# Patient Record
Sex: Male | Born: 1955 | Race: White | Hispanic: No | Marital: Single | Smoking: Former smoker
Health system: Southern US, Community
[De-identification: ages and names within clinical notes are randomized; demographics above are authoritative.]

## PROBLEM LIST (undated history)

## (undated) DIAGNOSIS — G2581 Restless legs syndrome: Secondary | ICD-10-CM

## (undated) DIAGNOSIS — H8109 Meniere's disease, unspecified ear: Secondary | ICD-10-CM

## (undated) DIAGNOSIS — E785 Hyperlipidemia, unspecified: Secondary | ICD-10-CM

## (undated) DIAGNOSIS — N529 Male erectile dysfunction, unspecified: Secondary | ICD-10-CM

## (undated) DIAGNOSIS — Z8042 Family history of malignant neoplasm of prostate: Secondary | ICD-10-CM

## (undated) DIAGNOSIS — J189 Pneumonia, unspecified organism: Secondary | ICD-10-CM

## (undated) DIAGNOSIS — B1920 Unspecified viral hepatitis C without hepatic coma: Secondary | ICD-10-CM

## (undated) DIAGNOSIS — I10 Essential (primary) hypertension: Secondary | ICD-10-CM

## (undated) DIAGNOSIS — I2510 Atherosclerotic heart disease of native coronary artery without angina pectoris: Secondary | ICD-10-CM

## (undated) HISTORY — DX: Meniere's disease, unspecified ear: H81.09

## (undated) HISTORY — PX: PROSTATECTOMY: SHX69

## (undated) HISTORY — DX: Restless legs syndrome: G25.81

## (undated) HISTORY — DX: Hyperlipidemia, unspecified: E78.5

## (undated) HISTORY — DX: Male erectile dysfunction, unspecified: N52.9

## (undated) HISTORY — DX: Pneumonia, unspecified organism: J18.9

## (undated) HISTORY — PX: INGUINAL HERNIA REPAIR: SUR1180

## (undated) HISTORY — DX: Essential (primary) hypertension: I10

## (undated) HISTORY — PX: OTHER SURGICAL HISTORY: SHX169

## (undated) HISTORY — DX: Atherosclerotic heart disease of native coronary artery without angina pectoris: I25.10

## (undated) HISTORY — DX: Unspecified viral hepatitis C without hepatic coma: B19.20

## (undated) HISTORY — DX: Family history of malignant neoplasm of prostate: Z80.42

---

## 2001-04-19 HISTORY — PX: CARDIAC CATHETERIZATION: SHX172

## 2001-10-10 ENCOUNTER — Inpatient Hospital Stay (HOSPITAL_COMMUNITY): Admission: EM | Admit: 2001-10-10 | Discharge: 2001-10-11 | Payer: Self-pay | Admitting: Cardiology

## 2004-02-26 ENCOUNTER — Encounter (HOSPITAL_COMMUNITY): Admission: RE | Admit: 2004-02-26 | Discharge: 2004-02-27 | Payer: Self-pay | Admitting: *Deleted

## 2004-02-26 ENCOUNTER — Ambulatory Visit: Payer: Self-pay | Admitting: Cardiology

## 2004-03-04 ENCOUNTER — Ambulatory Visit: Payer: Self-pay | Admitting: *Deleted

## 2004-04-19 DIAGNOSIS — C61 Malignant neoplasm of prostate: Secondary | ICD-10-CM

## 2004-04-19 HISTORY — DX: Malignant neoplasm of prostate: C61

## 2004-05-19 ENCOUNTER — Ambulatory Visit: Payer: Self-pay | Admitting: Gastroenterology

## 2004-06-03 ENCOUNTER — Ambulatory Visit (HOSPITAL_COMMUNITY): Admission: RE | Admit: 2004-06-03 | Discharge: 2004-06-03 | Payer: Self-pay | Admitting: Gastroenterology

## 2004-06-03 ENCOUNTER — Encounter (INDEPENDENT_AMBULATORY_CARE_PROVIDER_SITE_OTHER): Payer: Self-pay | Admitting: Specialist

## 2004-06-18 ENCOUNTER — Inpatient Hospital Stay (HOSPITAL_COMMUNITY): Admission: RE | Admit: 2004-06-18 | Discharge: 2004-06-23 | Payer: Self-pay | Admitting: Urology

## 2004-09-24 ENCOUNTER — Ambulatory Visit: Payer: Self-pay | Admitting: Internal Medicine

## 2004-10-29 ENCOUNTER — Ambulatory Visit: Payer: Self-pay | Admitting: Internal Medicine

## 2005-02-18 ENCOUNTER — Ambulatory Visit: Payer: Self-pay | Admitting: Gastroenterology

## 2005-03-04 ENCOUNTER — Ambulatory Visit: Payer: Self-pay | Admitting: Gastroenterology

## 2005-03-18 ENCOUNTER — Ambulatory Visit: Payer: Self-pay | Admitting: Gastroenterology

## 2005-04-22 ENCOUNTER — Ambulatory Visit: Payer: Self-pay | Admitting: Gastroenterology

## 2005-05-27 ENCOUNTER — Ambulatory Visit: Payer: Self-pay | Admitting: Gastroenterology

## 2005-06-24 ENCOUNTER — Ambulatory Visit: Payer: Self-pay | Admitting: Gastroenterology

## 2005-07-22 ENCOUNTER — Ambulatory Visit: Payer: Self-pay | Admitting: Gastroenterology

## 2005-08-19 ENCOUNTER — Ambulatory Visit: Payer: Self-pay | Admitting: Gastroenterology

## 2005-10-28 ENCOUNTER — Ambulatory Visit: Payer: Self-pay | Admitting: Gastroenterology

## 2006-01-20 ENCOUNTER — Ambulatory Visit: Payer: Self-pay | Admitting: Gastroenterology

## 2006-02-07 ENCOUNTER — Ambulatory Visit (HOSPITAL_COMMUNITY): Admission: RE | Admit: 2006-02-07 | Discharge: 2006-02-07 | Payer: Self-pay | Admitting: Internal Medicine

## 2006-02-07 ENCOUNTER — Ambulatory Visit: Payer: Self-pay | Admitting: Internal Medicine

## 2006-08-18 IMAGING — NM NM MYOCAR PERF EJECTION FRACTION
2 series · 12 of 12 positions shown · non-contrast
Comparison: none

CLINICAL DATA: 48-year-old gentleman with nonobstructive coronary disease at catheterization in [DATE]; now returns with chest pain and palpitations
 STRESS MYOVIEW STUDY:
 RADIONUCLIDE DATA:  Two day rest/stress protocol performed with 20.0/30.0 mCi 6cTTm Myoview. 
 STRESS DATA:  Treadmill exercise to a workload of 10 mets and a heart rate of 162, 94% of age ? predicted maximum.  Exercise discontinued due to fatigue; no chest pain reported.  Blood pressure increased from a resting value of 120/70 to 170/70 during exercise and 180/70 early in recovery, a normal response.  No arrhythmias noted. 
 EKG:  Normal sinus rhythm; borderline first degree AV block; low voltage; otherwise within normal limits.  
 STRESS EKG:  Insignificant upsloping ST segment depression.  
 SCINTIGRAPHIC DATA:  Acquisition notable for mild diaphragmatic attenuation.  Left ventricular size was normal.  On tomographic images reconstructed in standard planes, there was uniform and normal uptake of tracer in all myocardial segments.  The rest images were unchanged.  The gated reconstruction demonstrated normal regional and global LV systolic function as well as normal systolic accentuation of activity in all regions.  Estimated ejection fraction was .58.

[Series 1: cs cardiac tc hi dose · 6.52mm/px · 6 of 512 frames shown]
[frame 43/512]
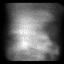
[frame 128/512]
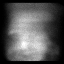
[frame 214/512]
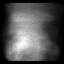
[frame 299/512]
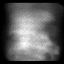
[frame 384/512]
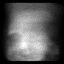
[frame 470/512]
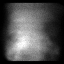

[Series 1: cr cardiac tc low dose · 6.52mm/px · 6 of 64 frames shown]
[frame 6/64]
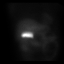
[frame 16/64]
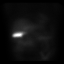
[frame 27/64]
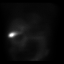
[frame 38/64]
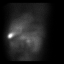
[frame 48/64]
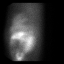
[frame 59/64]
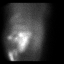

[12 of 12 positions shown; findings below may reference images not displayed]

IMPRESSION: Negative stress Myoview study revealing adequate exercise tolerance, a normal stress EKG, normal left ventricular size, normal left ventricular systolic function, and normal myocardial perfusion.

## 2010-09-04 NOTE — Op Note (Signed)
Theodore Hart, Theodore Hart              ACCOUNT NO.:  [id number]   MEDICAL RECORD NO.:  [id number]          PATIENT TYPE:  AMB   LOCATION:  DAY                           FACILITY:  [facility]   PHYSICIAN:  Ky Barban, M.D.DATE OF BIRTH:  [birth year]   DATE OF PROCEDURE:  06/18/2004  DATE OF DISCHARGE:                                 OPERATIVE REPORT   PREOPERATIVE DIAGNOSIS:  Carcinoma of the prostate.   POSTOPERATIVE DIAGNOSIS:  Carcinoma of the prostate.   PROCEDURE:  Radical retropubic prostatectomy, bilateral pelvic node  dissection.   SURGEON:  Ky Barban, M.D.   ASSISTANT:  Dennie Maizes, M.D.   ANESTHESIA:  Spinal plus general.   PROCEDURE:  Bilateral pelvic node dissection and radical retropubic  prostatectomy.   ESTIMATED BLOOD LOSS:  2000 mL.   REPLACEMENT:  Two units of packed cells.   Instrument, needle, sponge count correct.   PROCEDURE:  Patient given general endotracheal anesthesia and spinal  anesthesia, placed in semi-lithotomy position after the usual prep and  drape.  A #20 Foley catheter was inserted in the bladder.  Suprapubic  midline incision is made, ending about two inches below the umbilicus,  carried down through the subcutaneous tissue, rectus sheath incised, recti  separated in the midline.  The retropubic space is entered.  The external  iliac veins on both sides were exposed.  Self-retaining Bookwalter retractor  was placed.  We proceeded to do the lymph node dissection on the right side.  The fascia between the obturator nerve and vessels and between the external  iliac vein was completely removed, exposing the __________ fascia.  The  lymphatics were clipped and divided.  Specimen was sent for frozen section.  The pathologist called, and there were no lymph nodes.  I sent back and  removed some more tissue closer to the bladder and also skeletonized the  vessels along with the vessels and the obturator nerve.  Again the report  came  back negative.  I do not see any more tissue there.  The same thing  happened on the left side.  We do not see any lymphatic tissue in that area.  Whatever was there we removed, but he could not find any lymph nodes.  The  patient's PSA was below 10, so I am going to proceed and do his radical  retropubic prostatectomy.  The dorsal vein complex was ligated with 0 Vicryl  ties and the second stitch was placed at the level of the bladder neck, and  endopelvic fascia was opened up on both sides of the apex of the prostate.  Then the dorsal vein complex was divided.  Puboprostatic ligaments were  divided with retraction.  I again exposed the urethra, and a large right  angle is passed under the urethra and lifted it up, and anterior wall of the  urethra is divided and the Foley catheter was grabbed with the help of a  Vanderbilt clamp, and it was divided, keeping the clamp on the proximal end  and now the posterior wall of the urethra was divided under direct vision.  The  fascia along the side of the apex of the prostate was opened through the  endopelvic fascial opening and pushed the neurovascular bundles away from  the prostate from the apex and going toward superiorly alongside the lateral  lobe of the prostate and with slight traction on the catheter, the prostate  is lifted and neurovascular bundles along with the rectum is pushed away  from the prostate, and at this point the Denonvilliers' fascia is opened up  in the midline.  Seminal vesicles and vasa deferentia were exposed.  From  this end I was able to clip both vasa deferentia.  They were divided.  Seminal vesicles were separated from the sheath and the seminal vesicle  artery was clipped.  Part of the seminal vesicle on the left side came out,  sent as a separate specimen.  Now the dissection was carried out along the  side of the prostate.  The inferior and superior pedicles were clamped,  divided and ligated and divided.  The  anterior bladder neck was opened up.  The Foley catheter was removed, and a #5 ureteral catheter passed up both  ureters and stabilized to the bladder with 4-0 chromic stitch.  The  posterior part of the bladder neck was divided under vision.  Now the rest  of the superior pedicle was divided and ligated.  The remaining part of the  seminal vesicle came out from this side.  Specimen was removed.  Bleeders  were thoroughly inspected.  Some of them were fulgurated, and there is no  bleeding going on.  Proceeded to the bladder neck fashioning.  The extra  bladder neck which was opened was closed with 2-0 Vicryl interrupted  sutures.  The bladder neck mucosa was everted with a running stitch of 4-0  chromic to cover the bladder neck.  Now I am ready to do the anastomosis.  Operative site was thoroughly inspected and irrigated.  All the laps were  removed.  A #20 Foley catheter was inserted into the urethral stump and a  free tie was passed through the end of the catheter.  Balloon of the  catheter has been already checked.  Six stitches were placed in the bladder  neck and the urethral stump, and the Foley catheter was then placed in the  bladder and inflated with 30 mL of fluid.  The superior retractor blade  removed.  The bladder is pushed in the retropubic space, approximating the  urethral stump and the bladder neck.  While holding the traction on it, all  the six stitches were tied in the sequence they were placed.  The superior  stitch on each side was ligated with the dorsal vein complex stitch.  There  is no bleeding going on in the retropubic space, placed drain with a Shiley  sump, which came out along with the ureteral catheters through a separate  stab wound.  The ureteral catheters came out through the bladder separately,  not through the bladder neck.  The Shiley sump and the ureteral catheters  stabilized to the skin with a 0 silk stitch.  The rectus sheath closed with a running  stitch of 0 Vicryl.  Subcutaneous tissue was irrigated with  saline.  Skin was closed with staples.  Sterile gauze dressing applied.  Foley catheter left on drainage.  The patient left the operating room in  satisfactory condition.      MIJ/MEDQ  D:  06/18/2004  T:  06/18/2004  Job:  [id number]   cc:  Patrica Duel, M.D.  [address], [address]  [place]  [place] [address]  Fax: [phone]

## 2010-09-04 NOTE — H&P (Signed)
NAMEJUMA, OXLEY              ACCOUNT NO.:  [id number]   MEDICAL RECORD NO.:  [id number]          PATIENT TYPE:  AMB   LOCATION:                                FACILITY:  [facility]   PHYSICIAN:  Ky Barban, M.D.    DATE OF BIRTH:   DATE OF ADMISSION:  06/18/2004  DATE OF DISCHARGE:  LH                                HISTORY & PHYSICAL   CHIEF COMPLAINT:  Prostate cancer.   HISTORY:  A 55 year old gentleman who was seen by me on November 7 with a  PSA of 9.40 after he was referred to me by Dr. Nobie Putnam.  He has no  urological symptoms.  He was also found to have hepatitis C on routine  insurance physical exam.  No symptoms.  I did a prostate biopsy and our  pathologist read ________ positive for adenocarcinoma.  He got a second  opinion and this showed that he has more biopsy which are showing _________  positive and it is Gleason, grade is 5, 3+2.  The third biopsy is strongly  suspicious for low-grade adenocarcinoma.  I went over the biopsy report with  the patient.  Told him that how we would manage this problem.  Because of  his young age, I told him we need to be aggressive which means that we need  to do surgical treatment and remove the prostate.  But I also told him that  he can have radiotherapy.  The next time he brought his wife, so I went over  the procedure, risk/complications with both of them.  By this time, they  have already decided to have a radical prostatectomy.  I made it clear to  them they understand the complications:  1. Urinary incontinence sometimes  can be permanent.  2. Erectile dysfunction which will be permanent.  3. Need  for blood transfusion.  They understand and want me to go ahead and do a  radical retropubic prostatectomy and node dissection.  In the meantime, I  had him see his gastroenterologist in [place].  They have told him his  problem is minimal.  They do not see any contraindication to have a  prostatectomy.  So he is coming as  an outpatient and will undergo a radical  retropubic prostatectomy, then will be admitted to the hospital.   PAST MEDICAL HISTORY:  No history for diabetes or hypertension.  Had a left  inguinal hernia repair at age 43.  He also had a tracheotomy as a child when  he had pneumonia and hepatitis C.   FAMILY HISTORY:  No history of prostate cancer.   PERSONAL HISTORY:  Does not smoke or drink.   REVIEW OF SYSTEMS:  Unremarkable.   PHYSICAL EXAMINATION:  Blood pressure 117/80, temperature is normal.  CENTRAL NERVOUS SYSTEM:  No gross neurological deficits.  HEENT/NECK:  Negative.  CHEST:  Symmetrical.  HEART:  Regular sinus rhythm.  ABDOMEN:  Soft, flat.  Liver, kidneys and spleen are not palpable.  No CVA  tenderness.  GENITALIA:  External genitalia circumcised and meatus _________.  Testicles  are normal.  RECTAL:  Prostate 2+ smooth and firm.   IMPRESSION:  1.  Prostate cancer.  2.  Hepatitis C.   PLAN:  Radical retropubic prostatectomy, bilateral lymph node dissection,  frozen sections.      MIJ/MEDQ  D:  06/17/2004  T:  06/17/2004  Job:  [id number]   cc:   Patrica Duel, M.D.  [address]  [place]  [place] [address]  Fax: [phone]

## 2010-09-04 NOTE — Cardiovascular Report (Signed)
[place]. [facility]  Patient:    Theodore Hart, Theodore Hart Visit Number: [id number] MRN: [id number]          Service Type: MED Location: [phone] Attending Physician:  Learta Codding Dictated by:   Veneda Melter, M.D. [facility] Proc. Date: 10/11/01 Admit Date:  10/10/2001 Discharge Date: 10/11/2001   CC:         Luis Abed, M.D. [facility]  Jonell Cluck, M.D.  Selinda Flavin, M.D.   Cardiac Catheterization  PROCEDURES PERFORMED: 1. Left heart catheterization. 2. Left ventriculogram. 3. Selective coronary angiography. 4. Abdominal aortogram. 5. Intravascular ultrasound of the left anterior descending artery. 6. VasoSeal placement of the right femoral artery.  DIAGNOSES: 1. Mild coronary artery disease by angiogram. 2. Normal left ventricular systolic function.  HISTORY:  The patient is a 55 year old white male without known prior cardiac history.  He has a tobacco use and a strong family history of coronary disease who presents with nausea, diaphoresis, and substernal chest discomfort.  The patient was admitted to the hospital and ruled out for acute myocardial infarction and presents for further assessment.  DESCRIPTION OF PROCEDURE:  Informed consent was obtained.  The patient was brought to the catheterization lab.  A #6 French sheath was placed in the right femoral artery.  The #6 [place] and JR4 catheters were then used to engage the left and right coronary arteries and selective angiography performed in various projections using manual injections of contrast.  A #6 French pigtail catheter was then advanced to the left ventricle and a left ventriculogram performed using power injections of contrast.  The pigtail catheter was brought back into the descending aorta and an abdominal aortogram performed using power injections of contrast.  Preparations were then made for intravascular ultrasound of the LAD using a #6 [place] CLS 3.5 guide  catheter. The patient was given 3000 units of heparin intravenously.  A 0.014-inch Floppy wire was then used.  Intravascular ultrasound was then performed using automated pullback.  Repeat angiography was then performed after copious nitroglycerin and Verapamil for transient vasospasm.  This showed no vessel damage and resolution of the vasospasm.  The patient tolerated the procedure well.  The catheters and sheath were then removed and a VasoSeal closure device was deployed to the right femoral artery.  Hemostasis was achieved and the patient was transferred to the floor in stable condition.  He tolerated the procedure well.  Findings are as follows:  FINDINGS: 1. Left main trunk:  Large caliber vessel with mild irregularities.  2. LAD:  This begins as a large caliber vessel and provides three diagonal    branches in the mid section as it then extends to the apex.  The mid LAD    has mild irregularities of 30% most notable after the large second diagonal    branch.  The distal LAD has mild diffuse disease of 30% as well.  The    second diagonal branch is the largest of the vessels.  All diagonal    branches have mild irregularities.  3. Left circumflex artery:  This is a medium caliber vessel that provides    four small marginal branches.  There is mild diffuse disease of 30% in the    left circumflex system.  4. The right coronary artery is dominant.  This is a medium caliber vessel    that provides a posterior descending artery in its terminal segment.  The    right coronary artery has mild  irregularities of 20%.  The posterior    descending artery has focal narrowing of 30-40% in the mid section.  5. LV:  Normal end systolic and end diastolic dimensions.  Overall left    ventricular function is well preserved.  Ejection fraction of greater than    55%.  No mitral regurgitation.  6. LV pressure is 108/5.  Aortic is 108/80.  LVEDP equals 10.  7. Abdominal aorta is of normal  caliber.  The infrarenal segment is without    critical stenosis or dilatation.  The renal arteries are single and widely    patent bilaterally.  The iliac arteries are of normal caliber with only    mild atheromatous disease bilaterally.  ASSESSMENT AND PLAN:  The patient is a 55 year old gentleman with noncritical coronary artery disease and normal LV function.  Continued medical therapy and risk factor modification will be pursued.  Other causes of chest pain investigated and the patient randomized per protocol in the ASTEROID study for treatment of dyslipidemia. Dictated by:   Veneda Melter, M.D. [facility] Attending Physician:  Learta Codding DD:  10/11/01 TD:  10/12/01 Job: [id number]

## 2010-09-04 NOTE — Discharge Summary (Signed)
[place]. [facility]  Patient:    Theodore Hart, Theodore Hart Visit Number: [id number] MRN: [id number]          Service Type: MED Location: [phone] Attending Physician:  Learta Codding Dictated by:   Joellyn Rued, P.A.-C. Admit Date:  10/10/2001 Disc. Date: 10/11/01   CC:         Dr. Almond Lint in [place]  Dr. Patrica Duel in [facility], [address], [place], [place]  [address]   Referring Physician Discharge [place]  DATE OF BIRTH:  29-Apr-1955  ADMITTING PHYSICIAN:  Lewayne Bunting, M.D.  SUMMARY OF HISTORY:  The patient is a 55 year old white male who presented to [facility] after he developed the gradual onset of a dull ache in his left subscapular area radiating to his anterior chest and to his jaw that began on the day prior to his admission at [facility] (October 09, 2001). Associated with this he had profuse diaphoresis, nausea, and shortness of breath.  He presented to the emergency room and obtained prompt relief with sublingual nitroglycerin.  In retrospect, he describes episodes as a fist pushing down on his chest.  At [facility], EKGs and troponins were negative for myocardial infarction, although he does have a very early family history of premature coronary artery disease - his father died at the age of 77 with MI, labile hypertension, remote tobacco use, overweight, and inactivity.  Lipid status is unknown.  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  LABORATORY DATA:  At [facility] sodium was 142, potassium 3.5, BUN 9, creatinine 1.0, glucose 121.  Initial CK 77 with MB 0.9, troponin 0.02.  H&H 16.3 and 46.6, normal indices, platelets 311, wbcs 6.4.  Second CK 59 with MB 0.5 and troponin 0.01.  EKGs showed normal sinus rhythm, nonspecific ST-T wave changes.  HOSPITAL COURSE:  At [facility], Dr. Myrtis Ser saw the patient in consultation.  He was transferred to our  facility on June 24 to undergo cardiac catheterization.  This was performed on June 25 by Dr. Chales Abrahams. According to his progress note, he had an EF of 55%, mild irregularities in the left main, 30% lesion in the mid and proximal LAD.  the circumflex had four OMs.  He had a 30% proximal circumflex.  The RCA was dominant with a 30-40% PDA lesion.  Dr. Chales Abrahams felt that he had noncritical coronary artery disease with normal LV function and continue medical management and risk factor modification.  His catheterization site was closed with a VasoSeal post sheath removal.  Post bedrest he was ambulating without difficulty, catheterization site was intact; thus he was discharged home.  DISCHARGE MEDICATIONS: 1. He was given a new prescription for Protonix 40 mg q.d. 2. He was asked to continue:    a. HCTZ 25 q.d.    b. Meclizine 12.5 p.r.n.  ACTIVITY:  He was advised no lifting, driving, sexual activity, or heavy exertion for two days.  DIET:  Maintain low salt/fat/cholesterol diet.  SPECIAL INSTRUCTIONS:  He was advised to consider weight loss and exercise program.  WOUND CARE:  If he had any problems with the catheterization site he was asked to call the [facility] office immediately.  FOLLOW-UP:  He will make a follow-up appointment with Dr. Patrica Duel. Lipids were drawn prior to his discharge; however, not back yet in the computer.  He was instructed of this fact and that they will  be followed up with his primary care M.D. as an outpatient. Dictated by:   Joellyn Rued, P.A.-C. Attending Physician:  Learta Codding DD:  10/11/01 TD:  10/11/01 Job: [id number]

## 2010-09-04 NOTE — Procedures (Signed)
NAMEJANOAH, MENNA NO.:  [id number]   MEDICAL RECORD NO.:  [id number]           PATIENT TYPE:   LOCATION:                                 FACILITY:   PHYSICIAN:  [place] Bing, M.D.       DATE OF BIRTH:   DATE OF PROCEDURE:  02/26/2004  DATE OF DISCHARGE:                                  ECHOCARDIOGRAM   REFERRING PHYSICIAN:  Dr. Patrica Duel.  Dr. Vida Roller.   CLINICAL DATA:  A 55 year old gentleman with chest pain.   M-MODE:  1.  Aorta, 3.0.  2.  Left atrium 3.3.  3.  Septum 1.1.  4.  Posterior wall 1.1.  5.  LV diastole 4.1.  6.  LV systole 3.0.   FINDINGS:  1.  Technically adequate echocardiographic study.  2.  Normal left atrium, right atrium and right ventricle.  3.  Mild aortic valvular sclerosis with mild annular calcification; normal      valve function.  4.  Normal tricuspid valve with physiologic regurgitation and normal      estimated RV systolic pressure.  5.  Normal mitral valve.  6.  Normal pulmonic valve; normal pulmonary artery diameter.  7.  Normal IVC.  8.  Normal internal dimension of the left ventricle; borderline LVH; normal      regional and global LV systolic function.  9.  Normal Doppler examination.      RR/MEDQ  D:  02/27/2004  T:  02/27/2004  Job:  [id number]

## 2010-09-04 NOTE — Op Note (Signed)
NAMEJERRICO, Theodore Hart              ACCOUNT NO.:  [id number]   MEDICAL RECORD NO.:  [id number]          PATIENT TYPE:  AMB   LOCATION:  DAY                           FACILITY:  [facility]   PHYSICIAN:  R. Roetta Sessions, M.D. DATE OF BIRTH:  26-Jan-1956   DATE OF PROCEDURE:  02/07/2006  DATE OF DISCHARGE:                                  PROCEDURE NOTE   PROCEDURE:  Screening colonoscopy.   ENDOSCOPIST:  Jonathon Bellows, M.D.   INDICATIONS FOR PROCEDURE:  The patient is a 55 year old gentleman sent over  out of the courtesy of Dr. Patrica Duel for colorectal cancer screening.  He is devoid of any lower GI tract symptoms.  He has never had his lower GI  tract imaged and there is no family history for colorectal neoplasia.  Colonoscopy is now being done as a screening maneuver.  This approach has  been discussed with the patient at length.  Potential risks, benefits and  alternatives have been reviewed, questions answered and he is agreeable.  Please see documentation in the medical record.   PROCEDURE NOTE:  O2 saturation, blood pressure, pulse and respirations were  monitor throughout the entire procedure.   CONSCIOUS SEDATION:  Versed 5 mg IV, Demerol 75 mg IV in divided doses.   INSTRUMENT:  Olympus video chip system.   FINDINGS:  Digital rectal exam revealed no abnormalities.   ENDOSCOPIC FINDINGS:  The prep was good.   RECTUM:  Examination of the rectal mucosa including a retroflexed view of  the anal verge revealed no abnormalities.   COLON:  Colonic mucosa was surveyed from the rectosigmoid junction through  the left, transverse and right colon to the area of the appendiceal orifice,  ileocecal valve and cecum.  These structures were well seen and photographed  for the record.  From this level, the scope was slowly and cautiously  withdrawn.  All previously mentioned mucosal surfaces were again seen.  The  colonic mucosa appeared normal.  The patient tolerated the procedure  well  and was reactive at endoscopy.   IMPRESSION:  1. Normal rectum.  2. Normal colon.   RECOMMENDATIONS:  Repeat screening colonoscopy in 10 years.      Jonathon Bellows, M.D.  Electronically Signed     RMR/MEDQ  D:  02/07/2006  T:  02/08/2006  Job:  [id number]   cc:   Patrica Duel, M.D.  Fax: [phone]

## 2010-09-04 NOTE — Discharge Summary (Signed)
Hart, Theodore              ACCOUNT NO.:  [id number]   MEDICAL RECORD NO.:  [id number]          PATIENT TYPE:  INP   LOCATION:  A322                          FACILITY:  [facility]   PHYSICIAN:  Ky Barban, M.D.DATE OF BIRTH:  09-19-1955   DATE OF ADMISSION:  06/18/2004  DATE OF DISCHARGE:  03/07/2006LH                                 DISCHARGE SUMMARY   A 55 year old gentleman who was diagnosed with prostate cancer with biopsy  of the prostate because his PSA was elevated, and his PSA was 9.47.  Prostate biopsy shows that he has Gleason score of 5.  I discussed his  treatment options including radiotherapy, radical retropubic prostatectomy,  complications, benefits.  He elected to undergo radical prostatectomy.   The patient has hepatitis C.  Consultation preoperatively was obtained, and  it was advised that he can go ahead and have the surgery because he does not  need any treatment for now because his hepatitis C is so mild.   He underwent preoperative workup, CBC, urinalysis, MET-7, EKG, chest x-ray,  and all were normal.  He was taken to the operating room, underwent moderate  pelvic node dissection and radical retropubic prostatectomy.  It was done on  March 2.   He is doing fine.  He did spike a temperature during the night to 102.6.  Blood and urine culture were done, and he was stared on prophylactic  antibiotic with Cipro.  He was encouraged to do incentive spirometry.  His  urine is clear.  It should be mentioned that during the surgery he was given  2 units of packed cells also.   On the first postoperative day, his urine output is 1900 mL, and there is  some drainage of only 50 mL.  His potassium was 3.1, so we added KCl into  his IV fluids.   On the second postoperative day, sump is dry, so ureteral catheter and sump  drain were taken out.  He was discharged from ICU on March 6.  He had a  bowel movement.  It was noted that his sodium was 122, and I noted  that he  was drinking a lot of water which I told him to stop.  He was treated with  IV saline.  MET-7 in the morning was done, and sodium had come up to normal  to 136.   At this point, he was afebrile, up and walking, eating a regular diet.  I am  going to discharge him home.  Pathology report showed no involvement of the  margins.  His final pathology code is NO, NX, MX.  He will be followed by me  in the office.  I will take the stitches out next week and will see him  Thursday in the office.   FINAL DISCHARGE DIAGNOSES:  1.  Carcinoma of the prostate.  2.  Hepatitis C.   CONDITION ON DISCHARGE:  Improved.   DISCHARGE MEDICATIONS:  None.   FOLLOW UP:  I will see him back in the office on Thursday.      MIJ/MEDQ  D:  07/19/2004  T:  07/19/2004  Job:  [id number]   cc:   Patrica Duel, M.D.  [address]  [place]  [place] [address]  Fax: [phone]

## 2011-05-17 ENCOUNTER — Other Ambulatory Visit (HOSPITAL_COMMUNITY): Payer: Self-pay | Admitting: Internal Medicine

## 2011-05-19 ENCOUNTER — Other Ambulatory Visit (HOSPITAL_COMMUNITY): Payer: Self-pay | Admitting: Internal Medicine

## 2011-05-25 ENCOUNTER — Ambulatory Visit (HOSPITAL_COMMUNITY)
Admission: RE | Admit: 2011-05-25 | Discharge: 2011-05-25 | Disposition: A | Discharge status: Home / Self Care | Payer: BC Managed Care – PPO | Source: Ambulatory Visit | Attending: Internal Medicine | Admitting: Internal Medicine

## 2011-05-25 DIAGNOSIS — R7401 Elevation of levels of liver transaminase levels: Secondary | ICD-10-CM | POA: Insufficient documentation

## 2011-05-25 DIAGNOSIS — B1920 Unspecified viral hepatitis C without hepatic coma: Secondary | ICD-10-CM | POA: Insufficient documentation

## 2011-05-25 DIAGNOSIS — R7402 Elevation of levels of lactic acid dehydrogenase (LDH): Secondary | ICD-10-CM | POA: Insufficient documentation

## 2011-09-16 ENCOUNTER — Ambulatory Visit (INDEPENDENT_AMBULATORY_CARE_PROVIDER_SITE_OTHER): Payer: BC Managed Care – PPO | Admitting: Gastroenterology

## 2011-09-16 DIAGNOSIS — R945 Abnormal results of liver function studies: Secondary | ICD-10-CM

## 2011-09-17 LAB — CBC WITH DIFFERENTIAL/PLATELET
Basophils Absolute: 0.1 10*3/uL (ref 0.0–0.1)
Basophils Relative: 2 % — ABNORMAL HIGH (ref 0–1)
Eosinophils Absolute: 0.2 10*3/uL (ref 0.0–0.7)
Eosinophils Relative: 4 % (ref 0–5)
HCT: 41.8 % (ref 39.0–52.0)
Hemoglobin: 14.6 g/dL (ref 13.0–17.0)
Lymphocytes Relative: 31 % (ref 12–46)
Lymphs Abs: 1.6 10*3/uL (ref 0.7–4.0)
MCH: 31 pg (ref 26.0–34.0)
MCHC: 34.9 g/dL (ref 30.0–36.0)
MCV: 88.7 fL (ref 78.0–100.0)
Monocytes Absolute: 0.5 10*3/uL (ref 0.1–1.0)
Monocytes Relative: 10 % (ref 3–12)
Neutro Abs: 2.8 10*3/uL (ref 1.7–7.7)
Neutrophils Relative %: 53 % (ref 43–77)
Platelets: 266 10*3/uL (ref 150–400)
RBC: 4.71 MIL/uL (ref 4.22–5.81)
RDW: 13.2 % (ref 11.5–15.5)
WBC: 5.3 10*3/uL (ref 4.0–10.5)

## 2011-09-17 LAB — HEPATITIS C RNA QUANTITATIVE: HCV Quantitative: NOT DETECTED IU/mL (ref <43)

## 2011-09-17 LAB — IGG, IGA, IGM
IgA: 256 mg/dL (ref 68–379)
IgG (Immunoglobin G), Serum: 945 mg/dL (ref 650–1600)
IgM, Serum: 34 mg/dL — ABNORMAL LOW (ref 41–251)

## 2011-09-17 LAB — HEPATIC FUNCTION PANEL
ALT: 24 U/L (ref 0–53)
AST: 22 U/L (ref 0–37)
Albumin: 4.2 g/dL (ref 3.5–5.2)
Alkaline Phosphatase: 82 U/L (ref 39–117)
Bilirubin, Direct: 0.2 mg/dL (ref 0.0–0.3)
Indirect Bilirubin: 0.5 mg/dL (ref 0.0–0.9)
Total Bilirubin: 0.7 mg/dL (ref 0.3–1.2)
Total Protein: 6.5 g/dL (ref 6.0–8.3)

## 2011-09-17 LAB — IBC PANEL
%SAT: 36 % (ref 20–55)
TIBC: 315 ug/dL (ref 215–435)
UIBC: 201 ug/dL (ref 125–400)

## 2011-09-17 LAB — HEPATITIS B SURFACE ANTIGEN: Hepatitis B Surface Ag: NEGATIVE

## 2011-09-17 LAB — IRON: Iron: 114 ug/dL (ref 42–165)

## 2011-09-17 LAB — ALPHA-1-ANTITRYPSIN: A-1 Antitrypsin, Ser: 113 mg/dL (ref 90–200)

## 2011-09-17 LAB — PROTIME-INR
INR: 0.96 (ref <1.50)
Prothrombin Time: 13.2 seconds (ref 11.6–15.2)

## 2011-09-17 LAB — FERRITIN: Ferritin: 123 ng/mL (ref 22–322)

## 2011-09-17 LAB — ANA: Anti Nuclear Antibody(ANA): NEGATIVE

## 2011-09-20 LAB — ANTI-SMOOTH MUSCLE ANTIBODY, IGG: Smooth Muscle Ab: 5 U (ref <20)

## 2011-09-20 LAB — MITOCHONDRIAL ANTIBODIES: Mitochondrial M2 Ab, IgG: 0.27 (ref <0.91)

## 2011-09-23 ENCOUNTER — Encounter: Payer: Self-pay | Admitting: Gastroenterology

## 2011-09-23 NOTE — Progress Notes (Signed)
Theodore Hart, Theodore Hart  MR#:  [id number]      DATE:  09/16/2011  DOB:  October 23, 1955    cc: Consulting Physician:  K. Italy Hilty, MD, [facility], [address] [place], [place] [address], Fax [phone]  Primary Care Physician:  Same  Referring Physician:  Elfredia Nevins, MD, [facility], [address], [place], [place] [address], Fax [phone]    reason for visit referral:  Abnormal liver tests.   history:  The patient is a 56 year old gentleman who I have been asked to see in consultation by Dr. Sherwood Gambler regarding abnormal ALT and AST.   It will be recalled that the patient had previously been followed by [place] for his genotype 3 hepatitis C. I first saw him in consultation on 05/19/2004. He was sent in part of a life insurance exam in 2005. He was found to be hepatitis C antibody positive. This was confirmed with his primary care physician in mid October 2005. When seen by me on 05/19/2004, we the noted that he was genotype 3. He underwent a liver biopsy. When seen in our office on 05/19/2004, he was noted to be genotype 3. He underwent a liver biopsy on 06/03/2004, showing grade 2 stage I disease. 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 As far as I can see, I did not think that this was done. His ferritin, at his initial evaluation was 331, however.  He underwent treatment with Pegasys and ribavirin from 02/05/2005 to 07/16/2005, and had an SVR.  He was last seen on 01/20/2006, at which time his viral load was negative. He was suppose to return in 6 months' time, but did not.   More recently on 05/06/2011, as part of routine lab testing, he was found to have an AST of 54 and ALT 54. To investigate this without regard to his previous history of hepatitis C, he underwent a standard hepatitis acute panel,  which included a negative hepatitis B surface antigen on 05/19/2011. Not surprising his hepatitis C antibody was positive, as this would remain positive for life regardless of his viral load.   The patient has no symptoms to suggest active or decompensated liver disease. There are no symptoms to suggest cryoglobulin mediated disease. He is dark skinned, but he reports just returned from a vacation at the beach where there was a lot of sun exposure. There is no history of diabetes or heart failure.   With respect to risk factors for liver disease, he consumes only 3-4 beers during the 5 days of the week, 2-3 beers per weekend. He rarely drinks wine or liquor. There is no history of intravenous or intranasal drug use since his last appointment with [place]. He denies any history of tattoos or unsterile body piercing or blood transfusions prior to 1992. His family history is negative for liver disease. He has been vaccinated against hepatitis A and B, as part of his followup in our clinic.   PAST MEDICAL HISTORY:  Significant for genital herpes and Meniere disease. He reports approximately 5 years ago, he received 3-4 months of combination of prednisone and methotrexate to treat an autoimmune component to the Meniere disease.  He reports that at that time saw Dr. Kinnie Scales, and was told that his liver enzymes were normal. There is also history of genital herpes. He is seen by cardiology for preventative management as he  has a strong family history of cardiac disease, but he denies any cardiac disease. He is not a diabetic. History of prostate cancer in 2006, hypertension, and dyslipidemia.   PAST SURGICAL HISTORY:  None since last being seen. Previously had a tracheotomy at age 60 months and a herniorrhaphy, 27.   CURRENT MEDICATIONS:  1. Pravastatin 20 mg p.o. daily, which he stopped approximately a month ago, upon hearing about his abnormal liver tests.  2. Triamterene/hydrochlorothiazide 05/25 mg daily.   3. Metoprolol 25 mg daily.  4. Chromium 1000 mcg daily.  5. Potassium 99 mg p.r.n.  6. Calcium 600 mg daily.  7. Multivitamin daily.  8. Fish oil 2400 mg daily.  9. Cetirizine 10 mg daily, a generic for Zyrtec.  10. Omeprazole 20 mg p.o. p.r.n.   ALLERGIES: Denies.   HABITS: Smoking denies. Alcohol as above.   FAMILY HISTORY: As above.   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 He was accompanied by his girlfriend today. He has a son, a daughter, both age 25 in good health.   REVIEW OF SYSTEMS: All 10 systems reviewed today on the review of systems form, which was signed and placed in the chart. His CES-D was 5.   PHYSICAL EXAMINATION:   Constitutional: Well-appearing. He was tan as mentioned above. Vital signs: Height 68 inches, weight 173 pounds, blood pressure 122/85, pulse of 87, temperature 97.6 Fahrenheit. At his last clinic appointment, his weight was 173 pounds, on 01/20/2006. Ears, Nose, Mouth and Throat:  Unremarkable oropharynx.  No thyromegaly or neck masses.  Chest:  Resonant to percussion.  Clear to auscultation.  Cardiovascular:  Heart sounds normal S1, S2 without murmurs or rubs.  There is no peripheral edema.  Abdomen:  Normal bowel sounds.  No masses or tenderness.  I could not appreciate a liver edge or spleen tip.  I could not appreciate any hernias.  Lymphatics:  No cervical or inguinal lymphadenopathy.  Central Nervous System:  No asterixis or focal neurologic findings.  Dermatologic:  Anicteric without palmar erythema or spider angiomata.  Eyes:  Anicteric sclerae.  Pupils are equal and reactive to light.  LABORATORIES:  Previous labs on 05/06/2011, in addition to the liver tests, as mentioned above, his ALT was 76, total bilirubin 1.0, albumin 4.8, globulins 2.1. CBC unremarkable with a platelet count of 241. Triglycerides 136, TSH was normal at 0.953. I reviewed an ultrasound report from 05/25/2011, that showed that the liver was  normal in appearance.  On 05/19/2011, hepatitis B surface antigen was negative and the hepatitis C antibody, hepatitis C antibody as expected was positive.   ASSESSMENT:  The patient is a 56 year old gentleman with history of a genotype 3 hepatitis C with a liver biopsy on 06/03/2004, showing grade 2 stage I disease from hepatitis C. Of note, the iron on the liver biopsy was commented upon but ferritin was not significant enough to be concerned about hemochromatosis and apparently the biopsy was not quantitated, but he was HFE gene test negative on 09/24/2004, so it seems unlikely that he has hemochromatosis with a negative ferritin and negative HFE   gene testing. It was likely the iron was over called on the biopsy. However, of course, we cannot completely rule out the possibility of hemochromatosis as the cause of abnormal liver tests. Certainly with an SVR to treatment in treatment in the past, it is unlikely that this represents continued hepatitis C. It should be noted that the hepatitis C antibody was positive  for life and the determinant of active disease is by viral load and not by antibody. Nonalcoholic fatty liver disease seems unlikely considering that he does not have  central obesity. His ultrasound from 05/25/2011 did not show an echogenic liver. Drug-induced liver injury remains a possibility in that he was on a statin, although it in itself is not a contraindication to using a statin. There is an alcohol history, but the pattern of liver test abnormalities not consistent with alcohol. Autoimmune hepatitis and alpha-1 antitrypsin deficiency needs to be excluded as well.    In my discussion today with the patient and his girlfriend who accompanied him, we discussed the differential diagnosis for liver test abnormalities. I have explained to him that if he had an SVR in the past, I do not think this would represent continued hepatitis C infection. We discussed obtaining an HCV RNA to exclude  the possibility of continued hepatitis C. We discussed obtaining another set of labs and possibly repeating a liver biopsy should this be warranted. I also reviewed the ultrasound results with him in addition to his labs from Dr. Sharyon Medicus office.   PLAN:  1. Hepatitis A and B vaccination completed.  2. Standard liver tests.  3. Repeat HCV RNA.  4. Metabolic and autoimmune lab testing.  5. Followup will be determined based on the results of these tests.               Brooke Dare, MD   ADDENDUM:  All labs negative.  Liver tests are normal with AST 22 ALT 24.  HCV RNA undetectable.  The Hep C antibody will be positive for life but is NOT indicative of active infection  Even if there is a suspicion that a statin could have caused the liver tests to rise, the height of the liver tests should not have required stopping the statin.  Rather the liver tests could be monitored to make sure they do not double from the (elevated) baseline.  I have written to Mr Torrez.  There is no need to return.  [id number] .[id number]  D:  Thu May 30 17:00:28 2013 ; T:  Thu May 30 19:51:19 2013

## 2012-03-14 ENCOUNTER — Other Ambulatory Visit (HOSPITAL_COMMUNITY): Payer: Self-pay | Admitting: Cardiovascular Disease

## 2012-03-14 ENCOUNTER — Other Ambulatory Visit (HOSPITAL_COMMUNITY): Payer: Self-pay | Admitting: Internal Medicine

## 2012-03-14 DIAGNOSIS — I2510 Atherosclerotic heart disease of native coronary artery without angina pectoris: Secondary | ICD-10-CM

## 2012-03-14 DIAGNOSIS — R079 Chest pain, unspecified: Secondary | ICD-10-CM

## 2012-03-14 DIAGNOSIS — E785 Hyperlipidemia, unspecified: Secondary | ICD-10-CM

## 2012-04-04 ENCOUNTER — Ambulatory Visit (HOSPITAL_COMMUNITY)
Admission: RE | Admit: 2012-04-04 | Discharge: 2012-04-04 | Disposition: A | Discharge status: Home / Self Care | Payer: BC Managed Care – PPO | Source: Ambulatory Visit | Attending: Internal Medicine | Admitting: Internal Medicine

## 2012-04-04 DIAGNOSIS — R5383 Other fatigue: Secondary | ICD-10-CM | POA: Insufficient documentation

## 2012-04-04 DIAGNOSIS — I2510 Atherosclerotic heart disease of native coronary artery without angina pectoris: Secondary | ICD-10-CM | POA: Insufficient documentation

## 2012-04-04 DIAGNOSIS — R5381 Other malaise: Secondary | ICD-10-CM | POA: Insufficient documentation

## 2012-04-04 DIAGNOSIS — R0609 Other forms of dyspnea: Secondary | ICD-10-CM | POA: Insufficient documentation

## 2012-04-04 DIAGNOSIS — R0989 Other specified symptoms and signs involving the circulatory and respiratory systems: Secondary | ICD-10-CM | POA: Insufficient documentation

## 2012-04-04 DIAGNOSIS — E785 Hyperlipidemia, unspecified: Secondary | ICD-10-CM | POA: Insufficient documentation

## 2012-04-04 DIAGNOSIS — F17200 Nicotine dependence, unspecified, uncomplicated: Secondary | ICD-10-CM | POA: Insufficient documentation

## 2012-04-04 DIAGNOSIS — R079 Chest pain, unspecified: Secondary | ICD-10-CM | POA: Insufficient documentation

## 2012-04-04 DIAGNOSIS — Z8249 Family history of ischemic heart disease and other diseases of the circulatory system: Secondary | ICD-10-CM | POA: Insufficient documentation

## 2012-04-04 MED ORDER — TECHNETIUM TC 99M SESTAMIBI GENERIC - CARDIOLITE
30.1000 | Freq: Once | INTRAVENOUS | Status: AC | PRN
  Administered 2012-04-04: 30.1 via INTRAVENOUS

## 2012-04-04 MED ORDER — TECHNETIUM TC 99M SESTAMIBI GENERIC - CARDIOLITE
11.0000 | Freq: Once | INTRAVENOUS | Status: AC | PRN
  Administered 2012-04-04: 11 via INTRAVENOUS

## 2012-04-04 NOTE — Procedures (Addendum)
[facility] [address] [place] [address] [place] [address] [phone]  Cardiology Nuclear Med Study  Theodore Hart is a 56 y.o. male     MRN : [id number]     DOB: [birth year]  Procedure Date: 04/04/2012  Nuclear Med Background Indication for Stress Test:  Evaluation for Ischemia History:  CAD Cardiac Risk Factors: Family History - CAD, Lipids and Smoker  Symptoms:  Chest Pain, DOE and Fatigue   Nuclear Pre-Procedure Caffeine/Decaff Intake:  1:00am NPO After: 11:00am   IV Site: R Antecubital  IV 0.9% NS with Angio Cath:  22g  Chest Size (in):  42 IV Started by: Koren Shiver, CNMT  Height: 5\' 8"  (1.727 m)  Cup Size: n/a  BMI:  Body mass index is 27.37 kg/(m^2). Weight:  180 lb (81.647 kg)   Tech Comments:  Held metoprolol 24 hrs prior to test    Nuclear Med Study 1 or 2 day study: 1 day  Stress Test Type:  Stress  Order Authorizing Provider:  Zoila Shutter, MD   Resting Radionuclide: Technetium 35m Sestamibi  Resting Radionuclide Dose: 11.0 mCi   Stress Radionuclide:  Technetium 61m Sestamibi  Stress Radionuclide Dose: 30.1 mCi           Stress Protocol Rest HR: 84 Stress HR:184  Rest BP: 116/84 Stress BP: 149/72  Exercise Time (min): 10:00 METS: 11.70          Dose of Adenosine (mg):  n/a Dose of Lexiscan: n/a mg  Dose of Atropine (mg): n/a Dose of Dobutamine: n/a mcg/kg/min (at max HR)  Stress Test Technologist: Ernestene Mention, CCT Nuclear Technologist: Gonzella Lex, CNMT   Rest Procedure:  Myocardial perfusion imaging was performed at rest 45 minutes following the intravenous administration of Technetium 59m Sestamibi. Stress Procedure:  The patient performed treadmill exercise using a Bruce  Protocol for 10 minutes. The patient stopped due to leg fatigue and shortness of breath but no chest pain.  There were no significant ST-T wave changes.  Technetium 66m Sestamibi was injected at peak exercise and myocardial  perfusion imaging was performed after a brief delay.  Transient Ischemic Dilatation (Normal <1.22):  0.95 Lung/Heart Ratio (Normal <0.45):  0.40 QGS EDV:  67 ml QGS ESV:  19 ml LV Ejection Fraction: 72%  Signed by      Rest ECG: NSR - Normal EKG  Stress ECG: No significant change from baseline ECG  QPS Raw Data Images:  Normal; no motion artifact; normal heart/lung ratio. Stress Images:  Normal homogeneous uptake in all areas of the myocardium. Rest Images:  There is decreased uptake in the inferior wall felt to be due diaphragmatic attenuation. Subtraction (SDS):  No evidence of ischemia.  Impression Exercise Capacity:  Excellent exercise capacity. BP Response:  Normal blood pressure response. Clinical Symptoms:  No symptoms. ECG Impression:  No significant ST segment change suggestive of ischemia. Comparison with Prior Nuclear Study: No significant change from previous study  Overall Impression:  Normal stress nuclear study. Low risk stress nuclear study.  LV Wall Motion:  NL LV Function, EF 72%; NL Wall Motion   Jasher Barkan A, MD  04/04/2012 6:14 PM

## 2012-08-14 ENCOUNTER — Encounter: Payer: Self-pay | Admitting: Internal Medicine

## 2012-10-27 ENCOUNTER — Other Ambulatory Visit: Payer: Self-pay | Admitting: Internal Medicine

## 2012-10-30 ENCOUNTER — Other Ambulatory Visit: Payer: Self-pay | Admitting: Internal Medicine

## 2012-10-30 NOTE — Telephone Encounter (Signed)
Rx was sent to pharmacy electronically. 

## 2013-07-06 ENCOUNTER — Other Ambulatory Visit: Payer: Self-pay

## 2013-07-06 MED ORDER — PRAVASTATIN SODIUM 20 MG PO TABS: 20.0000 mg | ORAL_TABLET | Freq: Every day | ORAL | Status: AC

## 2013-07-06 NOTE — Telephone Encounter (Signed)
Rx was sent to pharmacy electronically. 

## 2013-08-10 ENCOUNTER — Other Ambulatory Visit: Payer: Self-pay

## 2013-08-10 MED ORDER — METOPROLOL SUCCINATE ER 25 MG PO TB24: 25.0000 mg | ORAL_TABLET | Freq: Every day | ORAL | Status: AC

## 2013-08-10 NOTE — Telephone Encounter (Signed)
Rx was sent to pharmacy electronically. 

## 2013-10-15 ENCOUNTER — Other Ambulatory Visit (HOSPITAL_COMMUNITY): Payer: Self-pay | Admitting: Internal Medicine

## 2013-10-15 DIAGNOSIS — R740 Nonspecific elevation of levels of transaminase and lactic acid dehydrogenase [LDH]: Secondary | ICD-10-CM

## 2013-10-15 DIAGNOSIS — R7401 Elevation of levels of liver transaminase levels: Secondary | ICD-10-CM

## 2013-10-15 DIAGNOSIS — K769 Liver disease, unspecified: Secondary | ICD-10-CM

## 2013-10-18 ENCOUNTER — Ambulatory Visit (HOSPITAL_COMMUNITY)
Admission: RE | Admit: 2013-10-18 | Discharge: 2013-10-18 | Disposition: A | Discharge status: Home / Self Care | Payer: 59 | Source: Ambulatory Visit | Attending: Internal Medicine | Admitting: Internal Medicine

## 2013-10-18 DIAGNOSIS — R740 Nonspecific elevation of levels of transaminase and lactic acid dehydrogenase [LDH]: Principal | ICD-10-CM

## 2013-10-18 DIAGNOSIS — R7401 Elevation of levels of liver transaminase levels: Secondary | ICD-10-CM | POA: Insufficient documentation

## 2013-10-18 DIAGNOSIS — K769 Liver disease, unspecified: Secondary | ICD-10-CM

## 2013-10-18 DIAGNOSIS — R7402 Elevation of levels of lactic acid dehydrogenase (LDH): Secondary | ICD-10-CM | POA: Insufficient documentation

## 2013-11-14 IMAGING — US US ABDOMEN COMPLETE
1 series · 14 of 25 positions shown · non-contrast
Comparison: None.

CLINICAL DATA: Hepatitis C.

COMPLETE ABDOMINAL ULTRASOUND

[Series 1: us abdomen complete · 0.23mm/px · 14 of 77 slices shown]
[im 1/77]
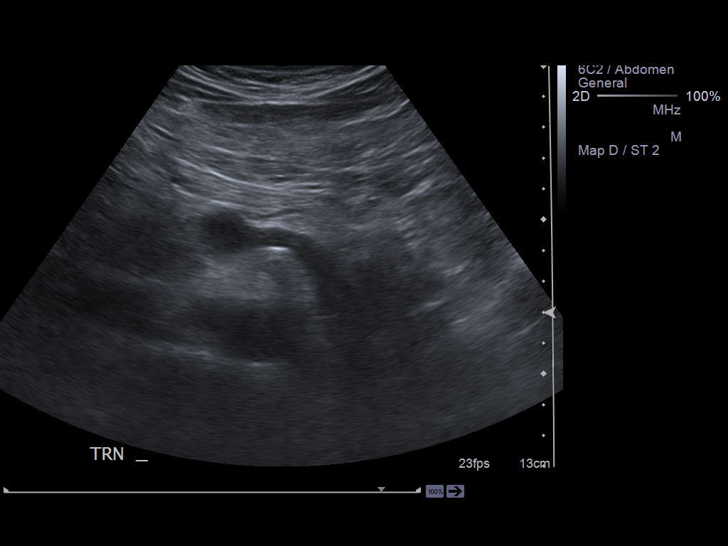
[im 7/77]
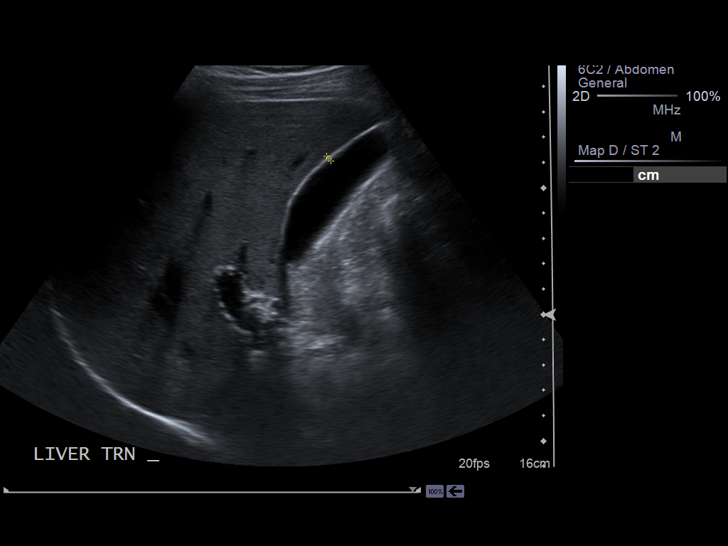
[im 13/77]
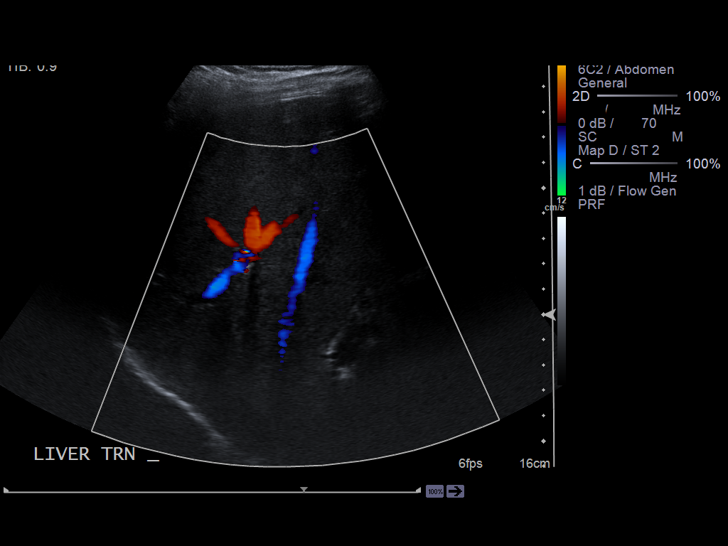
[im 20/77]
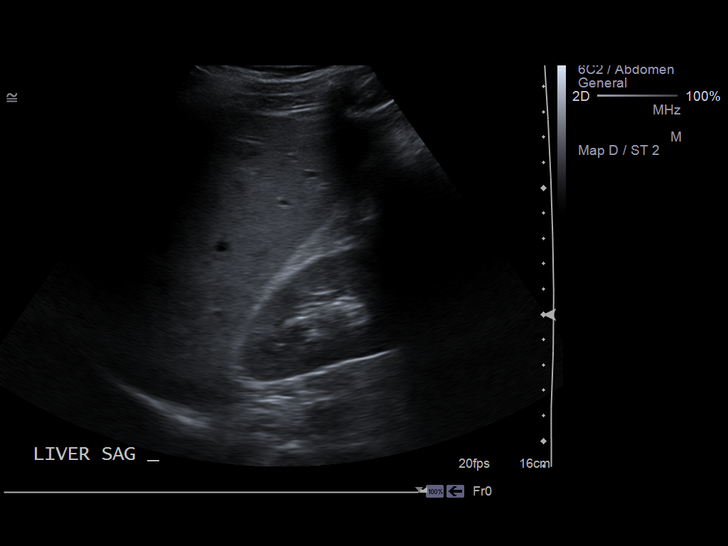
[im 26/77]
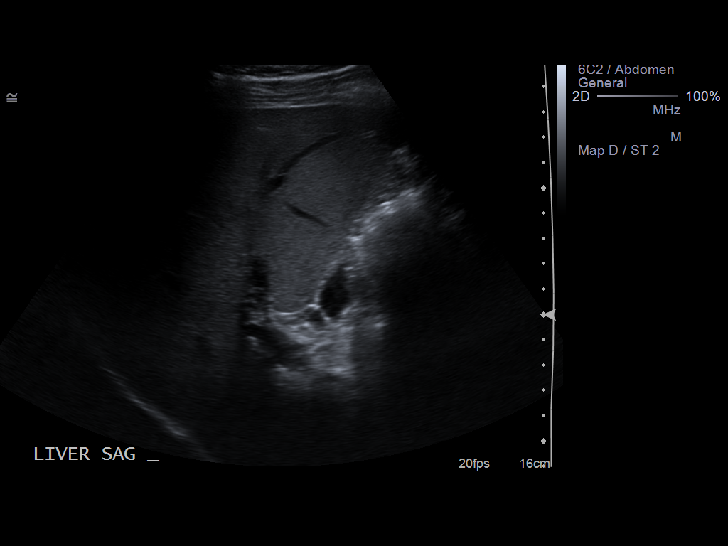
[im 29/77]
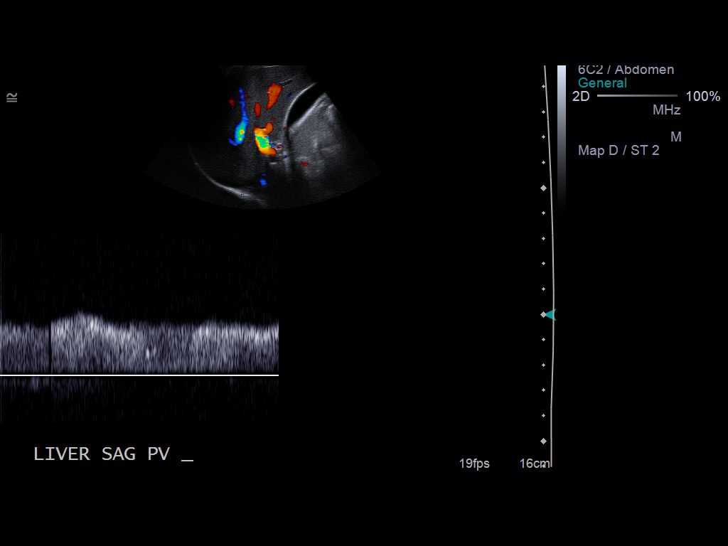
[im 35/77]
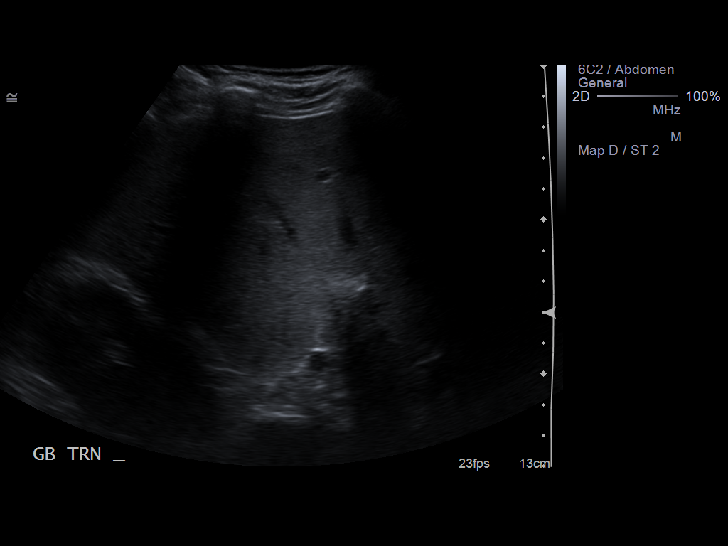
[im 42/77]
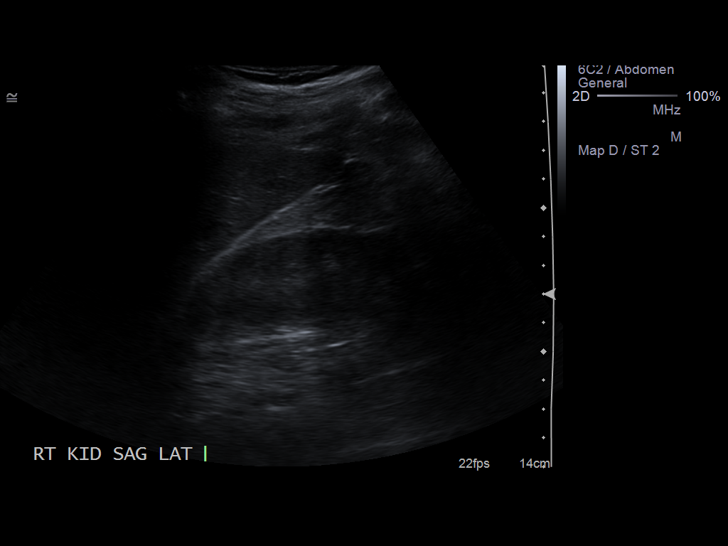
[im 48/77]
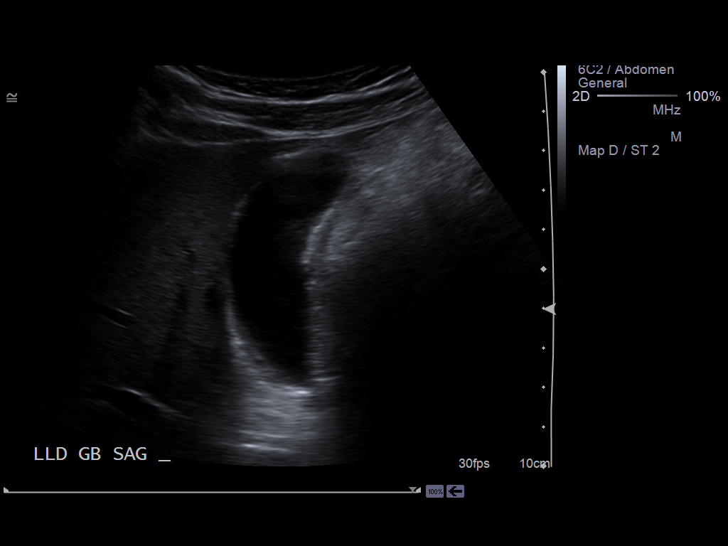
[im 51/77]
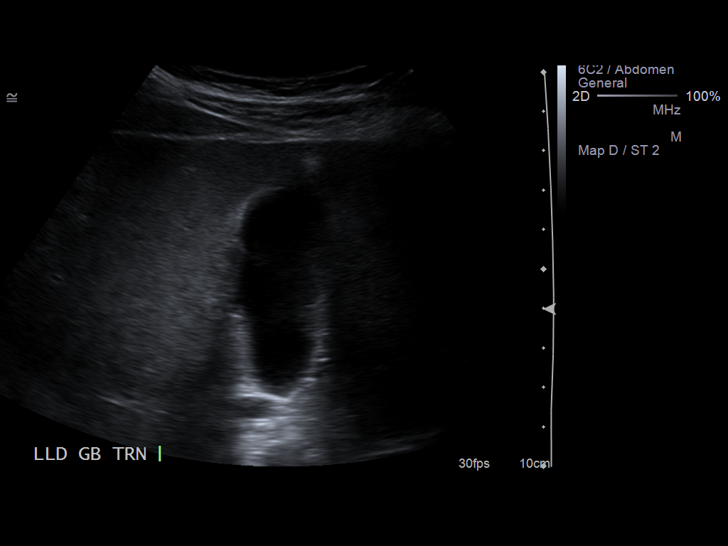
[im 58/77]
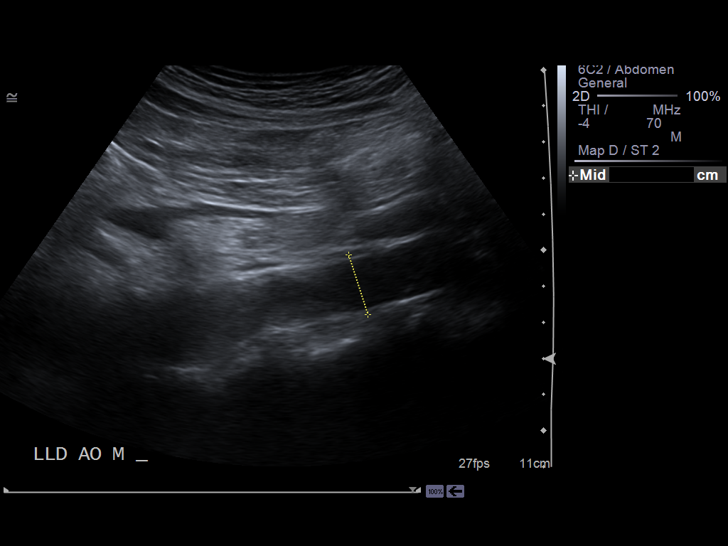
[im 64/77]
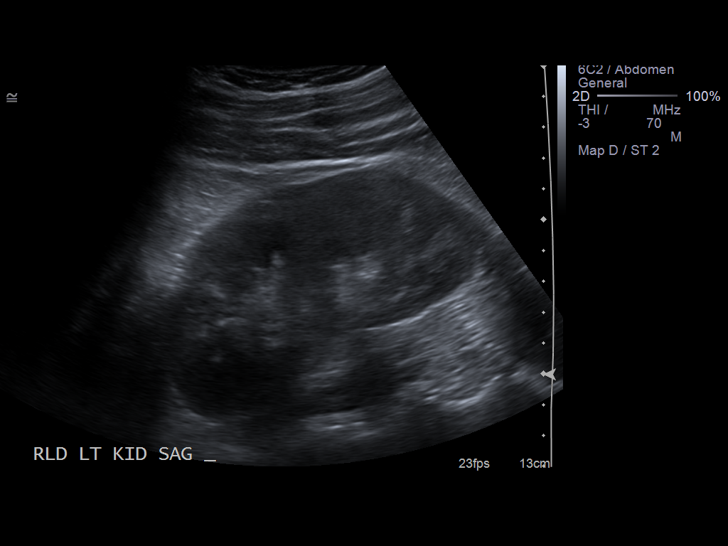
[im 70/77]
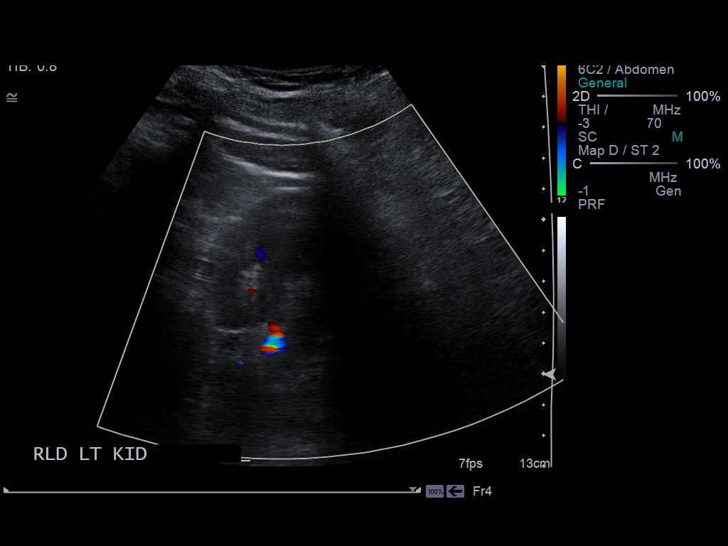
[im 77/77]
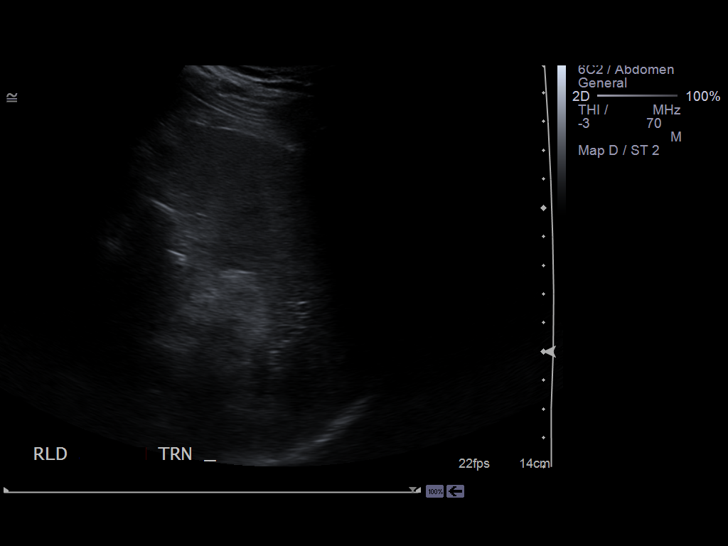

[14 of 25 positions shown; findings below may reference images not displayed]

FINDINGS: Gallbladder:  No gallbladder wall thickening, pericholecystic
fluid, or gallstone observed.  Sonographic Murphy's sign is absent.

Common bile duct:  Measures 5 mm in diameter, within normal limits.

Liver:  No focal lesion identified.  Within normal limits in
parenchymal echogenicity.

IVC:  Appears normal.

Pancreas:  Most of the pancreas is not well seen due to overlying
bowel gas.

Spleen:  Measures 7.2 cm craniocaudad and appears normal.

Right Kidney:  Measures 10.1 cm in length and appears normal.

Left Kidney:  Measures 1.0 cm in length and appears normal.

Abdominal aorta:  No aneurysm identified.
IMPRESSION: 1.  No focal hepatic lesion is sonographically.
2.  Most of the pancreas is not well seen due to overlying bowel
gas.
3.  Otherwise negative exam.

## 2014-08-29 ENCOUNTER — Encounter: Payer: Self-pay | Admitting: Cardiovascular Disease

## 2014-09-24 ENCOUNTER — Encounter: Payer: Self-pay | Admitting: *Deleted

## 2014-09-25 ENCOUNTER — Encounter: Payer: Self-pay | Admitting: Cardiovascular Disease

## 2014-09-25 ENCOUNTER — Ambulatory Visit (INDEPENDENT_AMBULATORY_CARE_PROVIDER_SITE_OTHER): Payer: 59 | Admitting: Cardiovascular Disease

## 2014-09-25 VITALS — BP 124/81 | HR 86 | Ht 68.0 in | Wt 186.0 lb

## 2014-09-25 DIAGNOSIS — Z719 Counseling, unspecified: Secondary | ICD-10-CM

## 2014-09-25 DIAGNOSIS — I2583 Coronary atherosclerosis due to lipid rich plaque: Principal | ICD-10-CM

## 2014-09-25 DIAGNOSIS — Z136 Encounter for screening for cardiovascular disorders: Secondary | ICD-10-CM | POA: Diagnosis not present

## 2014-09-25 DIAGNOSIS — E785 Hyperlipidemia, unspecified: Secondary | ICD-10-CM

## 2014-09-25 DIAGNOSIS — I2510 Atherosclerotic heart disease of native coronary artery without angina pectoris: Secondary | ICD-10-CM | POA: Diagnosis not present

## 2014-09-25 DIAGNOSIS — Z8249 Family history of ischemic heart disease and other diseases of the circulatory system: Secondary | ICD-10-CM

## 2014-09-25 DIAGNOSIS — Z713 Dietary counseling and surveillance: Secondary | ICD-10-CM

## 2014-09-25 DIAGNOSIS — Z7182 Exercise counseling: Secondary | ICD-10-CM

## 2014-09-25 NOTE — Patient Instructions (Signed)
Continue all current medications. Your physician wants you to follow up in:  1 year.  You will receive a reminder letter in the mail one-two months in advance.  If you don't receive a letter, please call our office to schedule the follow up appointment   

## 2014-09-25 NOTE — Progress Notes (Signed)
Patient ID: Theodore Hart, male   DOB: 1956/02/13, 59 y.o.   MRN: [id number]       CARDIOLOGY CONSULT NOTE  Patient ID: Theodore Hart MRN: [id number] DOB/AGE: 1955-11-21 59 y.o.  Admit date: (Not on file) Primary Physician Glo Herring., MD  Reason for Consultation: CAD  HPI: The patient is a 59 year old male with a history of nonobstructive coronary artery disease in June 2003, hyperlipidemia, prostate cancer s/p radical prostatectomy, and hepatitis C. Takes triamterene-HCTZ for Meniere's disease.  Coronary angiography in June 2003 demonstrated mid LAD 30% stenosis and distal LAD 30% stenosis, mild diffuse 30% stenosis in the circumflex, 30-40% PDA stenosis and 20% stenosis in the RCA. Left ventricular systolic function was normal.  He saw Dr. Debara Pickett in January 2014 for chest pain which was deemed noncardiac in etiology. He reportedly underwent a nuclear stress test which was normal on 04/04/2012, with a calculated LVEF of 72%.   His father died suddenly of an MI at age 41, his grandfather died of an MI at 36, and his great-grandfather died of an MI at 61. He used to smoke but quit cigarettes in 2000. 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 He has put on some weight since retirement but is now trying to lose it by walking and exercising. He denies exertional chest pain, palpitations, and shortness of breath. He denies orthopnea and leg swelling. He feels well.  ECG performed in the office today demonstrates normal sinus rhythm, heart rate 89 bpm, with no ischemic ST segment or T-wave abnormalities.   Soc: Married. Quit smoking in 2000. 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  Fam: His father died suddenly of an MI at age 22, his grandfather died of an MI at 52, and his great-grandfather died of an MI  at 30.   No Known Allergies  Current Outpatient Prescriptions  Medication Sig Dispense Refill  . ALPRAZolam (XANAX) 0.5 MG tablet Take 1 tablet by mouth at bedtime.    Marland Kitchen aspirin EC 81 MG tablet Take 81 mg by mouth daily.    . metoprolol succinate (TOPROL XL) 25 MG 24 hr tablet Take 1 tablet (25 mg total) by mouth daily. 10 tablet 0  . Multiple Vitamin (MULTIVITAMIN) tablet Take 1 tablet by mouth daily.    . Omega-3 Fatty Acids (FISH OIL) 1200 MG CAPS Take 1 capsule by mouth 2 (two) times daily.    . Potassium Gluconate 595 MG TBCR Take 1 tablet by mouth daily.    . pravastatin (PRAVACHOL) 20 MG tablet Take 1 tablet (20 mg total) by mouth daily. 15 tablet 0  . triamterene-hydrochlorothiazide (DYAZIDE) 37.5-25 MG per capsule TAKE 1 CAPSULE EVERY DAY 30 capsule 6  . Triprolidine-Pseudoephedrine (ANTIHISTAMINE PO) Take 1 tablet by mouth daily.    . vitamin C (ASCORBIC ACID) 500 MG tablet Take 500 mg by mouth daily.     No current facility-administered medications for this visit.    Past Medical History  Diagnosis Date  . Meniere disease     w/bilateral hearing aids  . FH: prostate cancer 2006    s/p radical protatectomy  . Hepatitis C   . Erectile dysfunction   . CAD (coronary artery disease)     nuclear stress test 04/04/12 EF 72% with no evidence of ischemia, normal wall motion  .  Hypertension   . Dyslipidemia     Past Surgical History  Procedure Laterality Date  . Inguinal hernia repair      age 49  . Other surgical history      tracheotomy as a child  . Prostatectomy    . Cardiac catheterization  2003    mild to moderate by cath    History   Social History  . Marital Status: Married    Spouse Name: N/A  . Number of Children: N/A  . Years of Education: N/A   Occupational History  . Not on file.   Social History Main Topics  . Smoking status: Former Smoker -- 1.00 packs/day for 28 years    Types: Cigarettes    Start date: 07/27/1970    Quit date: 04/19/1998    . Smokeless tobacco: Never Used  . Alcohol Use: Not on file  . Drug Use: Not on file  . Sexual Activity: Not on file   Other Topics Concern  . Not on file   Social History Narrative       Prior to Admission medications   Medication Sig Start Date End Date Taking? Authorizing Provider  ALPRAZolam Duanne Moron) 0.5 MG tablet Take 1 tablet by mouth at bedtime. 08/30/14  Yes Historical Provider, MD  aspirin EC 81 MG tablet Take 81 mg by mouth daily.   Yes Historical Provider, MD  metoprolol succinate (TOPROL XL) 25 MG 24 hr tablet Take 1 tablet (25 mg total) by mouth daily. 08/10/13  Yes Pixie Casino, MD  Multiple Vitamin (MULTIVITAMIN) tablet Take 1 tablet by mouth daily.   Yes Historical Provider, MD  Omega-3 Fatty Acids (FISH OIL) 1200 MG CAPS Take 1 capsule by mouth 2 (two) times daily.   Yes Historical Provider, MD  Potassium Gluconate 595 MG TBCR Take 1 tablet by mouth daily.   Yes Historical Provider, MD  pravastatin (PRAVACHOL) 20 MG tablet Take 1 tablet (20 mg total) by mouth daily. 07/06/13  Yes Pixie Casino, MD  triamterene-hydrochlorothiazide (DYAZIDE) 37.5-25 MG per capsule TAKE 1 CAPSULE EVERY DAY 10/30/12  Yes Pixie Casino, MD  Triprolidine-Pseudoephedrine (ANTIHISTAMINE PO) Take 1 tablet by mouth daily.   Yes Historical Provider, MD  vitamin C (ASCORBIC ACID) 500 MG tablet Take 500 mg by mouth daily.   Yes Historical Provider, MD     Review of systems complete and found to be negative unless listed above in HPI     Physical exam Blood pressure 124/81, pulse 86, height 5\' 8"  (1.727 m), weight 186 lb (84.369 kg). General: NAD Neck: No JVD, no thyromegaly or thyroid nodule.  Lungs: Clear to auscultation bilaterally with normal respiratory effort. CV: Nondisplaced PMI. Regular rate and rhythm, normal S1/S2, no S3/S4, no murmur.  No peripheral edema.  No carotid bruit.  Normal pedal pulses.  Abdomen: Soft, nontender, no hepatosplenomegaly, no distention.  Skin: Intact  without lesions or rashes.  Neurologic: Alert and oriented x 3.  Psych: Normal affect. Extremities: No clubbing or cyanosis.  HEENT: Normal.   ECG: Most recent ECG reviewed.  Labs:   Lab Results  Component Value Date   WBC 5.3 09/16/2011   HGB 14.6 09/16/2011   HCT 41.8 09/16/2011   MCV 88.7 09/16/2011   PLT 266 09/16/2011   No results for input(s): NA, K, CL, CO2, BUN, CREATININE, CALCIUM, PROT, BILITOT, ALKPHOS, ALT, AST, GLUCOSE in the last 168 hours.  Invalid input(s): LABALBU No results found for: CKTOTAL, CKMB, CKMBINDEX, TROPONINI No results found  for: CHOL No results found for: HDL No results found for: LDLCALC No results found for: TRIG No results found for: CHOLHDL No results found for: LDLDIRECT       Studies: No results found.  ASSESSMENT AND PLAN:  1. CAD: Mild to moderate nonobstructive disease in 2003 with normal stress test in 03/2012. Symptomatically stable with normal ECG. Continue ASA, beta blocker, and pravastatin. Strong family h/o heart disease and sudden cardiac death. Continue risk factor modification with diet and exercise, with counseling provided.  2. Hyperlipidemia: Will obtain copy of lipids from PCP. Continue pravastatin.   Dispo: f/u 1 year.   Signed: Kate Sable, M.D., F.A.C.C.  09/25/2014, 8:52 AM

## 2014-10-02 ENCOUNTER — Encounter: Payer: Self-pay | Admitting: *Deleted

## 2015-08-04 DIAGNOSIS — E782 Mixed hyperlipidemia: Secondary | ICD-10-CM | POA: Diagnosis not present

## 2015-08-06 DIAGNOSIS — Z0000 Encounter for general adult medical examination without abnormal findings: Secondary | ICD-10-CM | POA: Diagnosis not present

## 2015-08-06 DIAGNOSIS — H9193 Unspecified hearing loss, bilateral: Secondary | ICD-10-CM | POA: Diagnosis not present

## 2015-08-06 DIAGNOSIS — E663 Overweight: Secondary | ICD-10-CM | POA: Diagnosis not present

## 2015-08-06 DIAGNOSIS — E782 Mixed hyperlipidemia: Secondary | ICD-10-CM | POA: Diagnosis not present

## 2015-08-06 DIAGNOSIS — Z6827 Body mass index (BMI) 27.0-27.9, adult: Secondary | ICD-10-CM | POA: Diagnosis not present

## 2015-08-06 DIAGNOSIS — F419 Anxiety disorder, unspecified: Secondary | ICD-10-CM | POA: Diagnosis not present

## 2015-08-06 DIAGNOSIS — Z1389 Encounter for screening for other disorder: Secondary | ICD-10-CM | POA: Diagnosis not present

## 2015-09-23 ENCOUNTER — Ambulatory Visit (INDEPENDENT_AMBULATORY_CARE_PROVIDER_SITE_OTHER): Payer: PPO | Admitting: Cardiovascular Disease

## 2015-09-23 ENCOUNTER — Encounter: Payer: Self-pay | Admitting: Cardiovascular Disease

## 2015-09-23 VITALS — BP 122/74 | HR 98 | Ht 68.0 in | Wt 170.0 lb

## 2015-09-23 DIAGNOSIS — I2510 Atherosclerotic heart disease of native coronary artery without angina pectoris: Secondary | ICD-10-CM | POA: Diagnosis not present

## 2015-09-23 DIAGNOSIS — Z8249 Family history of ischemic heart disease and other diseases of the circulatory system: Secondary | ICD-10-CM

## 2015-09-23 DIAGNOSIS — I2583 Coronary atherosclerosis due to lipid rich plaque: Principal | ICD-10-CM

## 2015-09-23 DIAGNOSIS — E785 Hyperlipidemia, unspecified: Secondary | ICD-10-CM

## 2015-09-23 DIAGNOSIS — Z136 Encounter for screening for cardiovascular disorders: Secondary | ICD-10-CM

## 2015-09-23 NOTE — Progress Notes (Signed)
Patient ID: Theodore Hart, male   DOB: [birth year], 60 y.o.   MRN: [id number]      SUBJECTIVE: The patient presents for routine follow-up. He has a history of nonobstructive coronary artery disease in June 2003, hyperlipidemia, prostate cancer s/p radical prostatectomy, and hepatitis C. Takes triamterene-HCTZ for Meniere's disease.  Coronary angiography in June 2003 demonstrated mid LAD 30% stenosis and distal LAD 30% stenosis, mild diffuse 30% stenosis in the circumflex, 30-40% PDA stenosis and 20% stenosis in the RCA. Left ventricular systolic function was normal.  He saw Dr. Debara Pickett in January 2014 for chest pain which was deemed noncardiac in etiology. He reportedly underwent a nuclear stress test which was normal on 04/04/2012, with a calculated LVEF of 72%.  ECG performed in the office today which I personally reviewed demonstrates normal sinus rhythm with no ischemic ST segment or T-wave abnormalities, nor any arrhythmias, HR 99 bpm.  The patient denies any symptoms of chest pain, palpitations, shortness of breath, leg swelling, orthopnea, PND, and syncope.  During the wintertime he works out at [employer] 5 days a week and during the summer months he kayaks on a routine basis.   Review of Systems: As per "subjective", otherwise negative.  No Known Allergies  Current Outpatient Prescriptions  Medication Sig Dispense Refill  . ALPRAZolam (XANAX) 0.5 MG tablet Take 1 tablet by mouth at bedtime.    Marland Kitchen aspirin EC 81 MG tablet Take 81 mg by mouth daily.    . metoprolol succinate (TOPROL XL) 25 MG 24 hr tablet Take 1 tablet (25 mg total) by mouth daily. 10 tablet 0  . Multiple Vitamin (MULTIVITAMIN) tablet Take 1 tablet by mouth daily.    . Omega-3 Fatty Acids (FISH OIL) 1200 MG CAPS Take 1 capsule by mouth 2 (two) times daily.    . Potassium Gluconate 595 MG TBCR Take 1 tablet by mouth daily.    . pravastatin (PRAVACHOL) 20 MG tablet Take 1 tablet (20 mg total) by mouth daily. 15 tablet 0    . sildenafil (REVATIO) 20 MG tablet Take 20 mg by mouth.    . triamterene-hydrochlorothiazide (DYAZIDE) 37.5-25 MG per capsule TAKE 1 CAPSULE EVERY DAY 30 capsule 6  . Triprolidine-Pseudoephedrine (ANTIHISTAMINE PO) Take 1 tablet by mouth daily.    . vitamin C (ASCORBIC ACID) 500 MG tablet Take 500 mg by mouth daily.     No current facility-administered medications for this visit.    Past Medical History  Diagnosis Date  . Meniere disease     w/bilateral hearing aids  . FH: prostate cancer 2006    s/p radical protatectomy  . Hepatitis C   . Erectile dysfunction   . CAD (coronary artery disease)     nuclear stress test 04/04/12 EF 72% with no evidence of ischemia, normal wall motion  . Hypertension   . Dyslipidemia     Past Surgical History  Procedure Laterality Date  . Inguinal hernia repair      age 71  . Other surgical history      tracheotomy as a child  . Prostatectomy    . Cardiac catheterization  2003    mild to moderate by cath    Social History   Social History  . Marital Status: Married    Spouse Name: N/A  . Number of Children: N/A  . Years of Education: N/A   Occupational History  . Not on file.   Social History Main Topics  . Smoking status: Former Smoker --  1.00 packs/day for 28 years    Types: Cigarettes    Start date: 07/27/1970    Quit date: 04/19/1998  . Smokeless tobacco: Never Used  . Alcohol Use: Not on file  . Drug Use: Not on file  . Sexual Activity: Not on file   Other Topics Concern  . Not on file   Social History Narrative     Filed Vitals:   09/23/15 1339  BP: 122/74  Pulse: 98  Height: 5\' 8"  (1.727 m)  Weight: 170 lb (77.111 kg)  SpO2: 94%    PHYSICAL EXAM General: NAD HEENT: Normal. Neck: No JVD, no thyromegaly. Lungs: Clear to auscultation bilaterally with normal respiratory effort. CV: Nondisplaced PMI.  Regular rate and rhythm, normal S1/S2, no S3/S4, no murmur. No pretibial or periankle edema.  No carotid  bruit.   Abdomen: Soft, nontender, no distention.  Neurologic: Alert and oriented.  Psych: Normal affect. Skin: Normal. Musculoskeletal: No gross deformities.    ECG: Most recent ECG reviewed.      ASSESSMENT AND PLAN: 1. CAD: Mild to moderate nonobstructive disease in 2003 with normal stress test in 03/2012. Symptomatically stable with normal ECG. Continue ASA, beta blocker, and pravastatin. Strong family h/o heart disease and sudden cardiac death. Continue risk factor modification with diet and exercise, with counseling provided.  2. Hyperlipidemia: Will obtain copy of lipids from PCP. Continue pravastatin.   Dispo: f/u 1 year.   Kate Sable, M.D., F.A.C.C.

## 2015-09-23 NOTE — Patient Instructions (Signed)
Continue all current medications. Your physician wants you to follow up in:  1 year.  You will receive a reminder letter in the mail one-two months in advance.  If you don't receive a letter, please call our office to schedule the follow up appointment   

## 2015-09-29 ENCOUNTER — Ambulatory Visit: Payer: Self-pay | Admitting: Cardiovascular Disease

## 2015-12-18 ENCOUNTER — Telehealth: Payer: Self-pay | Admitting: Internal Medicine

## 2015-12-18 NOTE — Telephone Encounter (Signed)
Letter mailed

## 2015-12-18 NOTE — Telephone Encounter (Signed)
Pt is due 10 yr colonoscopy °

## 2016-03-01 DIAGNOSIS — F419 Anxiety disorder, unspecified: Secondary | ICD-10-CM | POA: Diagnosis not present

## 2016-03-01 DIAGNOSIS — Z6828 Body mass index (BMI) 28.0-28.9, adult: Secondary | ICD-10-CM | POA: Diagnosis not present

## 2016-03-01 DIAGNOSIS — Z23 Encounter for immunization: Secondary | ICD-10-CM | POA: Diagnosis not present

## 2016-03-23 DIAGNOSIS — E663 Overweight: Secondary | ICD-10-CM | POA: Diagnosis not present

## 2016-03-23 DIAGNOSIS — Z6829 Body mass index (BMI) 29.0-29.9, adult: Secondary | ICD-10-CM | POA: Diagnosis not present

## 2016-03-23 DIAGNOSIS — M25561 Pain in right knee: Secondary | ICD-10-CM | POA: Diagnosis not present

## 2016-03-23 DIAGNOSIS — M7121 Synovial cyst of popliteal space [Baker], right knee: Secondary | ICD-10-CM | POA: Diagnosis not present

## 2016-03-24 ENCOUNTER — Encounter: Payer: Self-pay | Admitting: Orthopaedic Surgery

## 2016-03-24 ENCOUNTER — Ambulatory Visit (INDEPENDENT_AMBULATORY_CARE_PROVIDER_SITE_OTHER): Payer: PPO | Admitting: Orthopaedic Surgery

## 2016-03-24 ENCOUNTER — Ambulatory Visit (INDEPENDENT_AMBULATORY_CARE_PROVIDER_SITE_OTHER): Payer: PPO

## 2016-03-24 VITALS — BP 116/74 | HR 83 | Temp 97.5°F | Ht 67.25 in | Wt 191.0 lb

## 2016-03-24 DIAGNOSIS — M25561 Pain in right knee: Secondary | ICD-10-CM

## 2016-03-24 DIAGNOSIS — M10061 Idiopathic gout, right knee: Secondary | ICD-10-CM

## 2016-03-24 MED ORDER — ALLOPURINOL 300 MG PO TABS: 300.0000 mg | ORAL_TABLET | Freq: Every day | ORAL | 5 refills | Status: DC

## 2016-03-24 MED ORDER — PREDNISONE 5 MG (21) PO TBPK: ORAL_TABLET | ORAL | 0 refills | Status: DC

## 2016-03-24 NOTE — Patient Instructions (Addendum)

## 2016-03-24 NOTE — Progress Notes (Signed)
Subjective:    Patient ID: Theodore Hart, male    DOB: 01-18-1956, 60 y.o.   MRN: [id number]  HPI  He has right knee pain that suddenly began swelling on Saturday night.  He has no trauma, no unusual event.  It got more swollen on Sunday.  He saw his family doctor yesterday and is referred here.  He has pain in walking. He has taken ibuprofen with minimal help.  He has used ice and heat with no help.    He has history of gout.  He is not taking anything for it.    Review of Systems  HENT: Positive for hearing loss. Negative for congestion.   Respiratory: Negative for cough and shortness of breath.   Cardiovascular: Negative for leg swelling.  Endocrine: Negative for cold intolerance.  Musculoskeletal: Positive for joint swelling. Negative for arthralgias and gait problem.  Allergic/Immunologic: Negative for environmental allergies.   Past Medical History:  Diagnosis Date  . CAD (coronary artery disease)    nuclear stress test 04/04/12 EF 72% with no evidence of ischemia, normal wall motion  . Dyslipidemia   . Erectile dysfunction   . FH: prostate cancer 2006   s/p radical protatectomy  . Hepatitis C   . Hypertension   . Meniere disease    w/bilateral hearing aids  . Pneumonia     Past Surgical History:  Procedure Laterality Date  . CARDIAC CATHETERIZATION  2003   mild to moderate by cath  . INGUINAL HERNIA REPAIR     age 74  . OTHER SURGICAL HISTORY     tracheotomy as a child  . PROSTATECTOMY      Current Outpatient Prescriptions on File Prior to Visit  Medication Sig Dispense Refill  . ALPRAZolam (XANAX) 0.5 MG tablet Take 1 tablet by mouth at bedtime.    Marland Kitchen aspirin EC 81 MG tablet Take 81 mg by mouth daily.    . metoprolol succinate (TOPROL XL) 25 MG 24 hr tablet Take 1 tablet (25 mg total) by mouth daily. 10 tablet 0  . pravastatin (PRAVACHOL) 20 MG tablet Take 1 tablet (20 mg total) by mouth daily. 15 tablet 0  . sildenafil (REVATIO) 20 MG tablet Take 20 mg  by mouth.    . triamterene-hydrochlorothiazide (DYAZIDE) 37.5-25 MG per capsule TAKE 1 CAPSULE EVERY DAY 30 capsule 6  . Multiple Vitamin (MULTIVITAMIN) tablet Take 1 tablet by mouth daily.    . Omega-3 Fatty Acids (FISH OIL) 1200 MG CAPS Take 1 capsule by mouth 2 (two) times daily.    . Potassium Gluconate 595 MG TBCR Take 1 tablet by mouth daily.    . Triprolidine-Pseudoephedrine (ANTIHISTAMINE PO) Take 1 tablet by mouth daily.    . vitamin C (ASCORBIC ACID) 500 MG tablet Take 500 mg by mouth daily.     No current facility-administered medications on file prior to visit.     Social History   Social History  . Marital status: Single    Spouse name: N/A  . Number of children: N/A  . Years of education: N/A   Occupational History  . Not on file.   Social History Main Topics  . Smoking status: Former Smoker    Packs/day: 1.00    Years: 28.00    Types: Cigarettes    Start date: 07/27/1970    Quit date: 04/19/1998  . Smokeless tobacco: Never Used  . Alcohol use Not on file  . Drug use: Unknown  . Sexual activity: Not on  file   Other Topics Concern  . Not on file   Social History Narrative  . No narrative on file    Family History  Problem Relation Age of Onset  . Alzheimer's disease Mother   . Hypertension Father   . Heart attack Father   . Hypertension Sister   . Hypertension Brother   . Cancer Brother   . Heart attack Paternal Grandfather   . Stroke Maternal Grandfather   . Heart disease      BP 116/74   Pulse 83   Temp 97.5 F (36.4 C)   Ht 5' 7.25" (1.708 m)   Wt 191 lb (86.6 kg)   BMI 29.69 kg/m      Objective:   Physical Exam  Constitutional: He is oriented to person, place, and time. He appears well-developed and well-nourished.  HENT:  Head: Normocephalic and atraumatic.  Eyes: Conjunctivae and EOM are normal. Pupils are equal, round, and reactive to light.  Neck: Normal range of motion. Neck supple.  Cardiovascular: Normal rate, regular rhythm  and intact distal pulses.   Pulmonary/Chest: Effort normal.  Abdominal: Soft.  Musculoskeletal: He exhibits tenderness (Pain right knee with large effusion, ROM 5 to 100 with pain, stable, no increased heat.  Left knee negative.  Limp right.).  Neurological: He is alert and oriented to person, place, and time. He has normal reflexes. He displays normal reflexes. No cranial nerve deficit. He exhibits normal muscle tone. Coordination normal.  Skin: Skin is warm and dry.  Psychiatric: He has a normal mood and affect. His behavior is normal. Judgment and thought content normal.   X-rays were done of the right knee, reported separately.  PROCEDURE NOTE:  The patient request injection, verbal consent was obtained.  The right knee was prepped appropriately after time out was performed.   Sterile technique was observed and anesthesia was provided by ethyl chloride and a 20-gauge needle was used to inject the knee area.  A 16-gauge needle was then used to aspirate the knee.  Color of fluid aspirated was off yellowish color  Total cc's aspirated was 40.    Sample of joint fluid sent to lab for uric acid crystals.  A band aid dressing was applied.  The patient was advised to apply ice later today and tomorrow to the injection sight as needed.     Assessment & Plan:   Encounter Diagnoses  Name Primary?  . Acute pain of right knee Yes  . Acute idiopathic gout of right knee    I have talked to him about gout.  I feel his pain is from gout.  He may have a torn meniscus but the gout is the most likely problem.  I have given samples of Uloric to take.  I have given Rx for allopurinol and prednisone dose pack.  Return in two weeks.  Call if any problem.  Precautions given.  Electronically Signed Sanjuana Kava, MD 12/6/201710:30 AM

## 2016-03-25 LAB — URIC ACID

## 2016-04-07 ENCOUNTER — Ambulatory Visit (INDEPENDENT_AMBULATORY_CARE_PROVIDER_SITE_OTHER): Payer: PPO | Admitting: Orthopaedic Surgery

## 2016-04-07 ENCOUNTER — Encounter: Payer: Self-pay | Admitting: Orthopaedic Surgery

## 2016-04-07 VITALS — BP 117/75 | HR 85 | Temp 97.7°F | Ht 67.25 in | Wt 194.0 lb

## 2016-04-07 DIAGNOSIS — M25561 Pain in right knee: Secondary | ICD-10-CM

## 2016-04-07 DIAGNOSIS — M10061 Idiopathic gout, right knee: Secondary | ICD-10-CM | POA: Diagnosis not present

## 2016-04-07 NOTE — Progress Notes (Signed)
Patient [name], male DOB:Oct 31, 1955, 60 y.o. [id number]  Chief Complaint  Patient presents with  . Follow-up    right knee    HPI  Theodore Hart is a 60 y.o. male who has gout of the right knee. He is much improved after the injection and taking his medicine.  I have told him he needs to continue his medicine daily for the gout from now on.  He says he will.  I will see him as needed. HPI  Body mass index is 30.16 kg/m.  ROS  Review of Systems  HENT: Positive for hearing loss. Negative for congestion.   Respiratory: Negative for cough and shortness of breath.   Cardiovascular: Negative for leg swelling.  Endocrine: Negative for cold intolerance.  Musculoskeletal: Positive for joint swelling. Negative for arthralgias and gait problem.  Allergic/Immunologic: Negative for environmental allergies.    Past Medical History:  Diagnosis Date  . CAD (coronary artery disease)    nuclear stress test 04/04/12 EF 72% with no evidence of ischemia, normal wall motion  . Dyslipidemia   . Erectile dysfunction   . FH: prostate cancer 2006   s/p radical protatectomy  . Hepatitis C   . Hypertension   . Meniere disease    w/bilateral hearing aids  . Pneumonia     Past Surgical History:  Procedure Laterality Date  . CARDIAC CATHETERIZATION  2003   mild to moderate by cath  . INGUINAL HERNIA REPAIR     age 9  . OTHER SURGICAL HISTORY     tracheotomy as a child  . PROSTATECTOMY      Family History  Problem Relation Age of Onset  . Alzheimer's disease Mother   . Hypertension Father   . Heart attack Father   . Hypertension Sister   . Hypertension Brother   . Cancer Brother   . Heart attack Paternal Grandfather   . Stroke Maternal Grandfather   . Heart disease      Social History Social History  Substance Use Topics  . Smoking status: Former Smoker    Packs/day: 1.00    Years: 28.00    Types: Cigarettes    Start date: 07/27/1970    Quit date: 04/19/1998   . Smokeless tobacco: Never Used  . Alcohol use Not on file    No Known Allergies  Current Outpatient Prescriptions  Medication Sig Dispense Refill  . allopurinol (ZYLOPRIM) 300 MG tablet Take 1 tablet (300 mg total) by mouth daily. 30 tablet 5  . ALPRAZolam (XANAX) 0.5 MG tablet Take 1 tablet by mouth at bedtime.    Marland Kitchen aspirin EC 81 MG tablet Take 81 mg by mouth daily.    . metoprolol succinate (TOPROL XL) 25 MG 24 hr tablet Take 1 tablet (25 mg total) by mouth daily. 10 tablet 0  . Multiple Vitamin (MULTIVITAMIN) tablet Take 1 tablet by mouth daily.    . Omega-3 Fatty Acids (FISH OIL) 1200 MG CAPS Take 1 capsule by mouth 2 (two) times daily.    . Potassium Gluconate 595 MG TBCR Take 1 tablet by mouth daily.    . pravastatin (PRAVACHOL) 20 MG tablet Take 1 tablet (20 mg total) by mouth daily. 15 tablet 0  . predniSONE (STERAPRED UNI-PAK 21 TAB) 5 MG (21) TBPK tablet Take 6 pills first day; 5 pills second day; 4 pills third day; 3 pills fourth day; 2 pills next day and 1 pill last day. 21 tablet 0  . sildenafil (REVATIO) 20 MG  tablet Take 20 mg by mouth.    . triamterene-hydrochlorothiazide (DYAZIDE) 37.5-25 MG per capsule TAKE 1 CAPSULE EVERY DAY 30 capsule 6  . Triprolidine-Pseudoephedrine (ANTIHISTAMINE PO) Take 1 tablet by mouth daily.    . vitamin C (ASCORBIC ACID) 500 MG tablet Take 500 mg by mouth daily.     No current facility-administered medications for this visit.      Physical Exam  Blood pressure 117/75, pulse 85, temperature 97.7 F (36.5 C), height 5' 7.25" (1.708 m), weight 194 lb (88 kg).  Constitutional: overall normal hygiene, normal nutrition, well developed, normal grooming, normal body habitus. Assistive device:none  Musculoskeletal: gait and station Limp none, muscle tone and strength are normal, no tremors or atrophy is present.  .  Neurological: coordination overall normal.  Deep tendon reflex/nerve stretch intact.  Sensation normal.  Cranial nerves II-XII  intact.   Skin:   Normal overall no scars, lesions, ulcers or rashes. No psoriasis.  Psychiatric: Alert and oriented x 3.  Recent memory intact, remote memory unclear.  Normal mood and affect. Well groomed.  Good eye contact.  Cardiovascular: overall no swelling, no varicosities, no edema bilaterally, normal temperatures of the legs and arms, no clubbing, cyanosis and good capillary refill.  Lymphatic: palpation is normal.  He has full ROM of the right knee, no pain, normal gait.  NV intact.  The patient has been educated about the nature of the problem(s) and counseled on treatment options.  The patient appeared to understand what I have discussed and is in agreement with it.  Encounter Diagnoses  Name Primary?  . Acute pain of right knee Yes  . Acute idiopathic gout of right knee     PLAN Call if any problems.  Precautions discussed.  Continue current medications.   Return to clinic PRN   Electronically Signed Sanjuana Kava, MD 12/20/201710:39 AM

## 2016-04-09 IMAGING — US US ABDOMEN COMPLETE
1 series · 14 of 25 positions shown · non-contrast
Comparison: 05/25/2011

CLINICAL DATA: Transaminitis

EXAM:
ULTRASOUND ABDOMEN COMPLETE

[Series 1: us abdomen complete · 0.21mm/px · 14 of 98 slices shown]
[im 1/98]
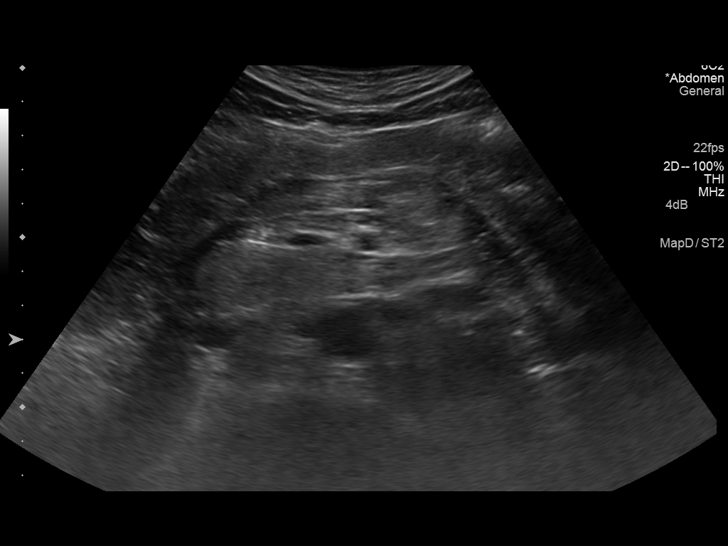
[im 9/98]
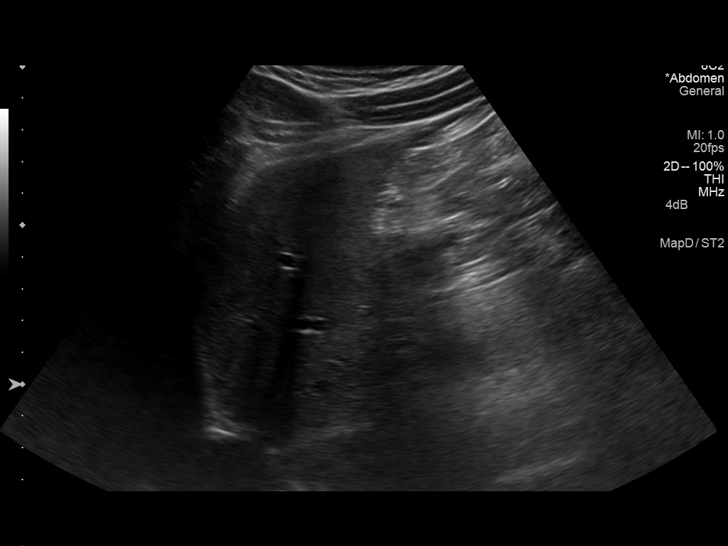
[im 17/98]
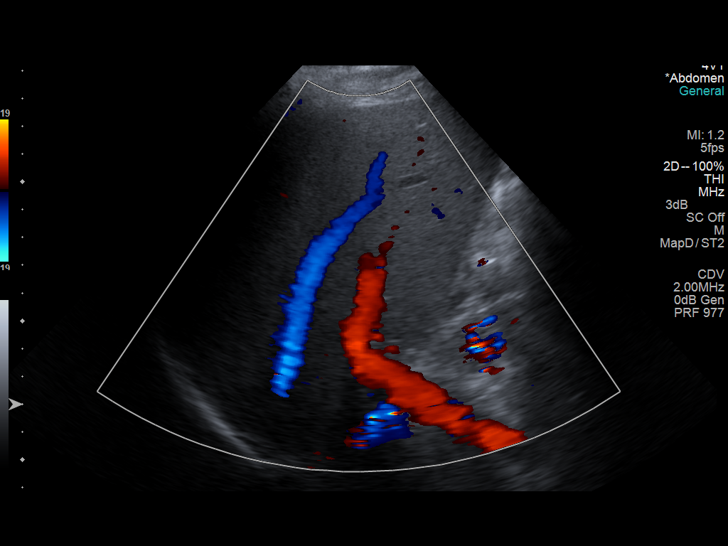
[im 25/98]
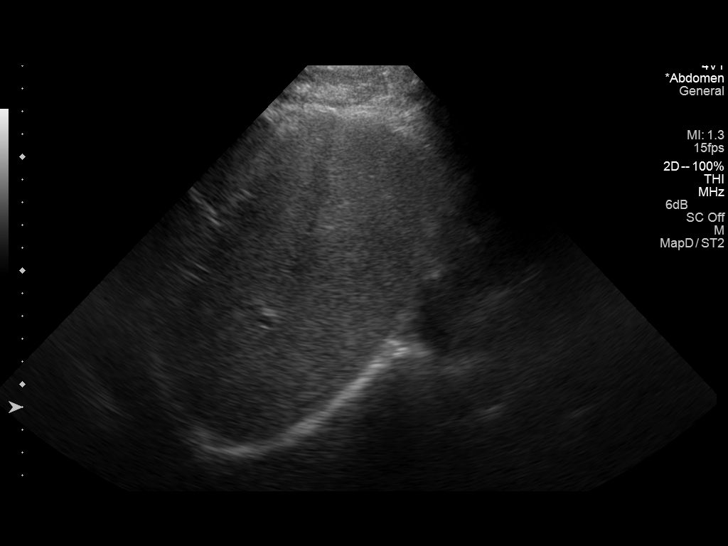
[im 33/98]
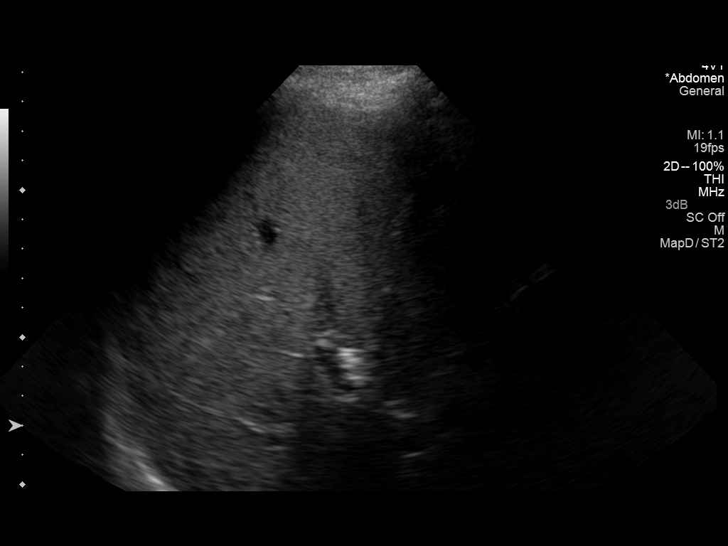
[im 37/98]
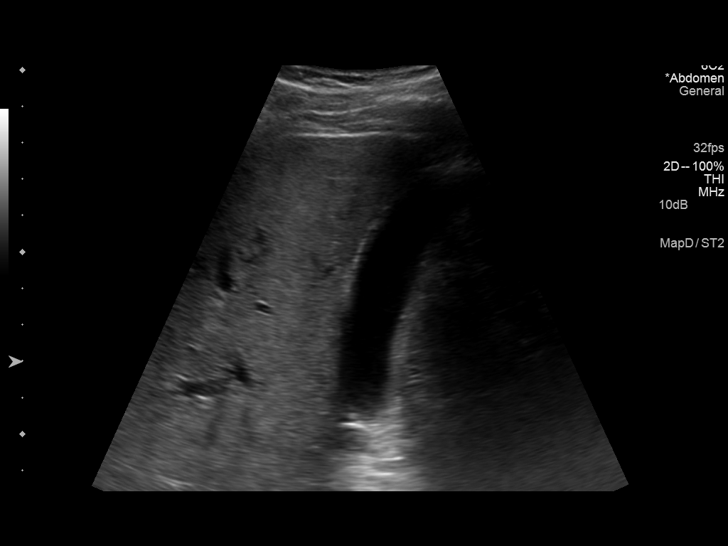
[im 45/98]
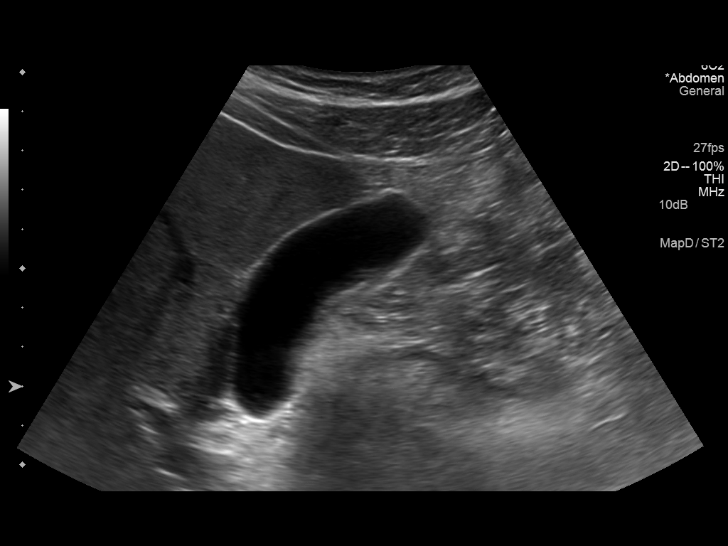
[im 53/98]
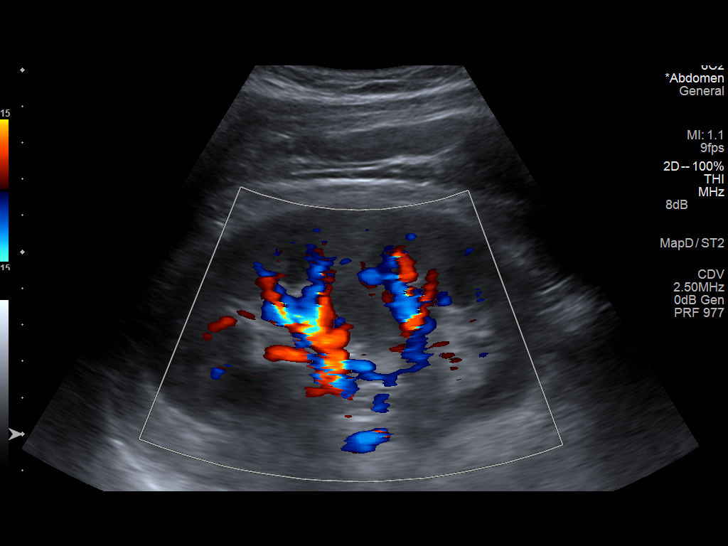
[im 61/98]
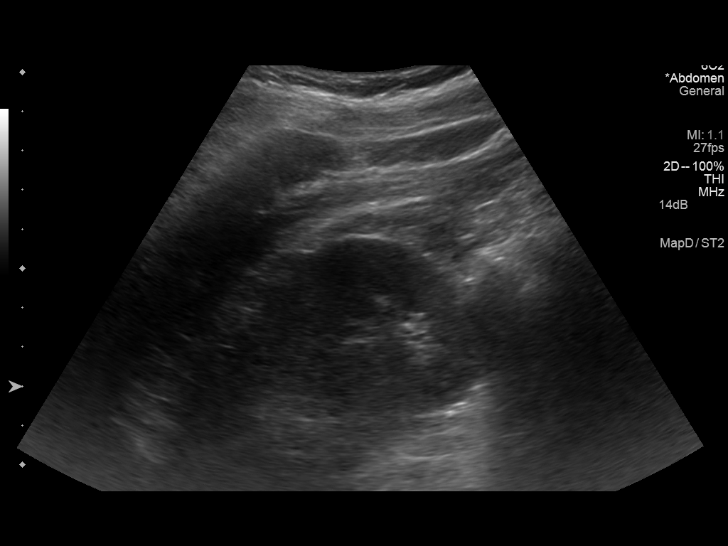
[im 65/98]
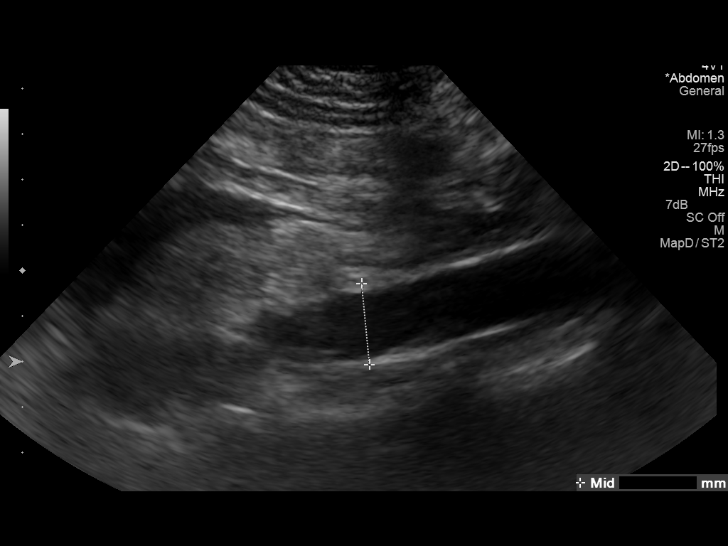
[im 73/98]
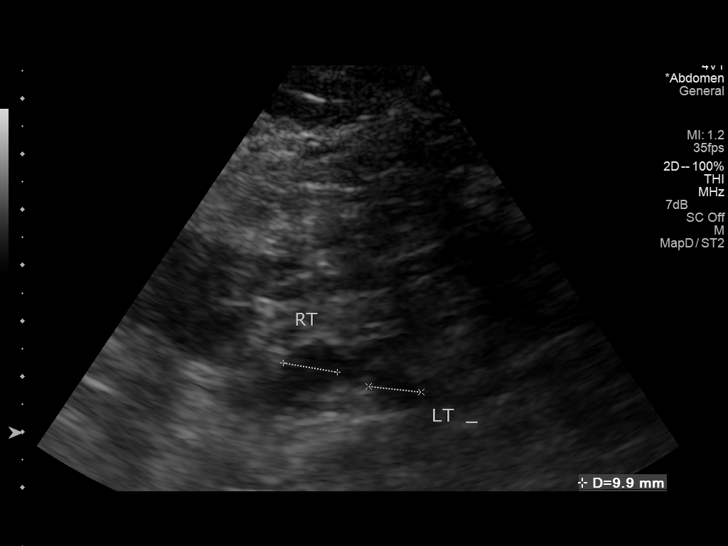
[im 81/98]
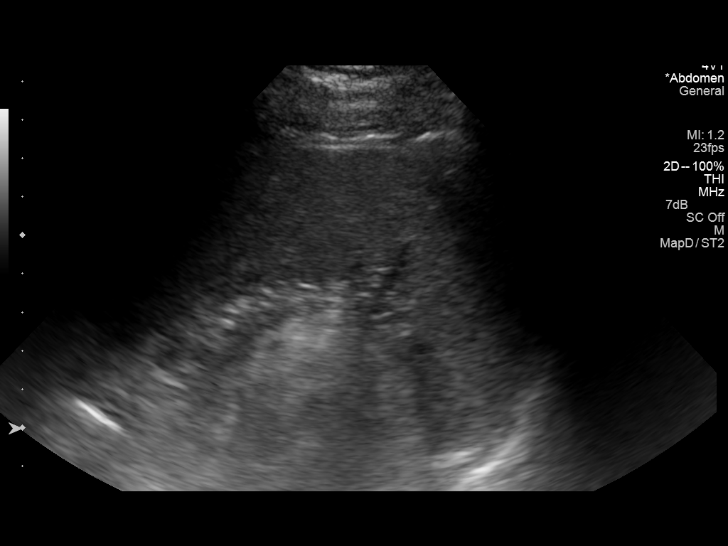
[im 89/98]
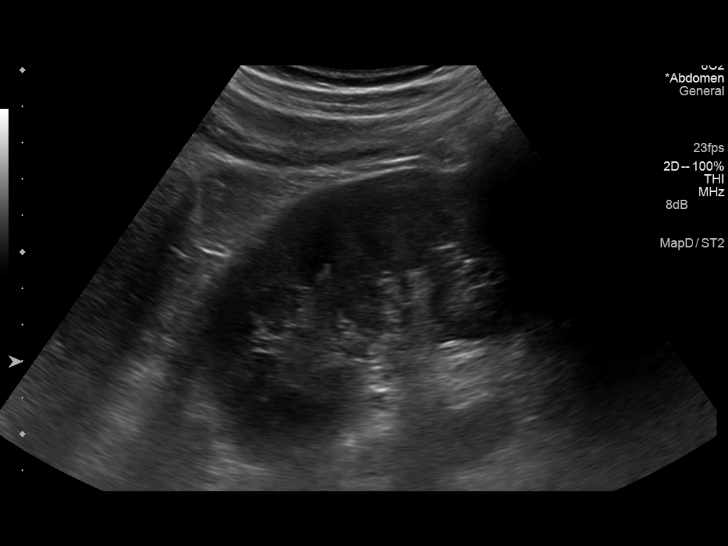
[im 98/98]
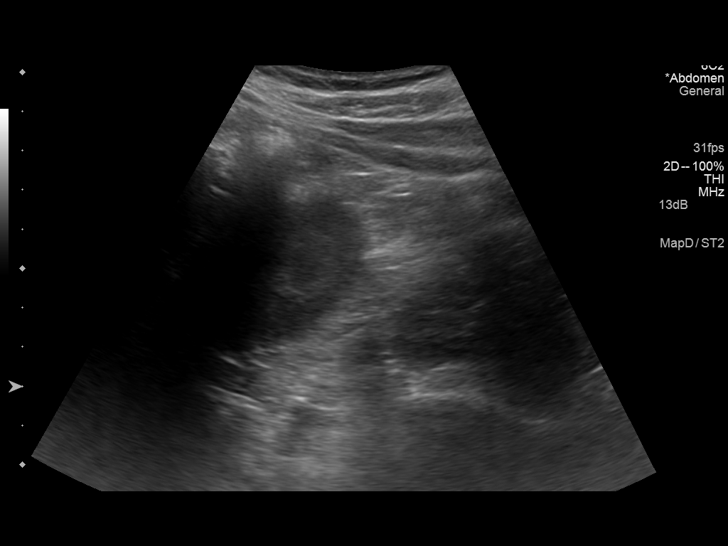

[14 of 25 positions shown; findings below may reference images not displayed]

FINDINGS: Gallbladder:

Normally distended without stones or wall thickening.

No pericholecystic fluid or sonographic Murphy sign.

Common bile duct:

Diameter: Normal caliber 5 mm diameter

Liver:

Normal echogenicity without mass or nodularity. Hepatopetal portal
venous flow.

IVC:

Normal appearance

Pancreas:

Incompletely visualized due to bowel gas, visualized portion normal
appearance

Spleen:

Normal appearance, 10.6 cm length

Right Kidney:

Length: 11.8 cm.  Normal morphology without mass or hydronephrosis.

Left Kidney:

Length: 11.0 cm.  Normal morphology without mass or hydronephrosis.

Abdominal aorta:

Portion obscured by bowel gas.  Visualized portions normal caliber.

Other findings:

No free fluid
IMPRESSION: Incomplete visualization of pancreas and aorta due to obscuration by
bowel gas.

Otherwise normal exam.

## 2016-08-06 DIAGNOSIS — Z0001 Encounter for general adult medical examination with abnormal findings: Secondary | ICD-10-CM | POA: Diagnosis not present

## 2016-08-06 DIAGNOSIS — H9193 Unspecified hearing loss, bilateral: Secondary | ICD-10-CM | POA: Diagnosis not present

## 2016-08-06 DIAGNOSIS — R7309 Other abnormal glucose: Secondary | ICD-10-CM | POA: Diagnosis not present

## 2016-08-06 DIAGNOSIS — Z1389 Encounter for screening for other disorder: Secondary | ICD-10-CM | POA: Diagnosis not present

## 2016-08-06 DIAGNOSIS — Z6828 Body mass index (BMI) 28.0-28.9, adult: Secondary | ICD-10-CM | POA: Diagnosis not present

## 2016-08-06 DIAGNOSIS — I2510 Atherosclerotic heart disease of native coronary artery without angina pectoris: Secondary | ICD-10-CM | POA: Diagnosis not present

## 2016-08-06 DIAGNOSIS — E782 Mixed hyperlipidemia: Secondary | ICD-10-CM | POA: Diagnosis not present

## 2016-08-09 DIAGNOSIS — H2513 Age-related nuclear cataract, bilateral: Secondary | ICD-10-CM | POA: Diagnosis not present

## 2016-08-09 DIAGNOSIS — H40033 Anatomical narrow angle, bilateral: Secondary | ICD-10-CM | POA: Diagnosis not present

## 2016-09-27 ENCOUNTER — Telehealth: Payer: Self-pay | Admitting: Orthopaedic Surgery

## 2016-10-11 ENCOUNTER — Ambulatory Visit (INDEPENDENT_AMBULATORY_CARE_PROVIDER_SITE_OTHER): Payer: PPO | Admitting: Cardiovascular Disease

## 2016-10-11 ENCOUNTER — Encounter: Payer: Self-pay | Admitting: Cardiovascular Disease

## 2016-10-11 VITALS — BP 129/77 | HR 82 | Ht 67.0 in | Wt 185.0 lb

## 2016-10-11 DIAGNOSIS — E782 Mixed hyperlipidemia: Secondary | ICD-10-CM

## 2016-10-11 DIAGNOSIS — Z8249 Family history of ischemic heart disease and other diseases of the circulatory system: Secondary | ICD-10-CM | POA: Diagnosis not present

## 2016-10-11 DIAGNOSIS — I2583 Coronary atherosclerosis due to lipid rich plaque: Secondary | ICD-10-CM

## 2016-10-11 DIAGNOSIS — I2510 Atherosclerotic heart disease of native coronary artery without angina pectoris: Secondary | ICD-10-CM

## 2016-10-11 NOTE — Patient Instructions (Signed)

## 2016-10-11 NOTE — Progress Notes (Signed)
SUBJECTIVE: Patient presents for follow-up of nonobstructive coronary artery disease. He also has a history of hyperlipidemia, prostate cancer s/p radical prostatectomy, and hepatitis C.   Coronary angiography in June 2003 demonstrated mid LAD 30% stenosis and distal LAD 30% stenosis, mild diffuse 30% stenosis in the circumflex, 30-40% PDA stenosis and 20% stenosis in the RCA. Left ventricular systolic function was normal.  He saw Dr. Debara Pickett in January 2014 for chest pain which was deemed noncardiac in etiology. He reportedly underwent a nuclear stress test which was normal on 04/04/2012, with a calculated LVEF of 72%.  He exercises several days a week and lifts weights and does cardio for 1-1/2 hours. With this he denies exertional symptoms such as chest pain, shortness of breath, and palpitations.  He stopped taking hydrochlorothiazide for Mnire's disease due to near syncope when moving from a sitting to standing position.   Review of Systems: As per "subjective", otherwise negative.  No Known Allergies  Current Outpatient Prescriptions  Medication Sig Dispense Refill  . allopurinol (ZYLOPRIM) 300 MG tablet TAKE ONE TABLET BY MOUTH ONCE DAILY 30 tablet 5  . ALPRAZolam (XANAX) 0.5 MG tablet Take 1 tablet by mouth at bedtime.    Marland Kitchen aspirin EC 81 MG tablet Take 81 mg by mouth daily.    . metoprolol succinate (TOPROL XL) 25 MG 24 hr tablet Take 1 tablet (25 mg total) by mouth daily. 10 tablet 0  . Multiple Vitamin (MULTIVITAMIN) tablet Take 1 tablet by mouth daily.    . Omega-3 Fatty Acids (FISH OIL) 1200 MG CAPS Take 1 capsule by mouth 2 (two) times daily.    . Potassium Gluconate 595 MG TBCR Take 1 tablet by mouth daily.    . pravastatin (PRAVACHOL) 20 MG tablet Take 1 tablet (20 mg total) by mouth daily. 15 tablet 0  . sildenafil (REVATIO) 20 MG tablet Take 20 mg by mouth.    . vitamin C (ASCORBIC ACID) 500 MG tablet Take 500 mg by mouth daily.     No current  facility-administered medications for this visit.     Past Medical History:  Diagnosis Date  . CAD (coronary artery disease)    nuclear stress test 04/04/12 EF 72% with no evidence of ischemia, normal wall motion  . Dyslipidemia   . Erectile dysfunction   . FH: prostate cancer 2006   s/p radical protatectomy  . Hepatitis C   . Hypertension   . Meniere disease    w/bilateral hearing aids  . Pneumonia     Past Surgical History:  Procedure Laterality Date  . CARDIAC CATHETERIZATION  2003   mild to moderate by cath  . INGUINAL HERNIA REPAIR     age 58  . OTHER SURGICAL HISTORY     tracheotomy as a child  . PROSTATECTOMY      Social History   Social History  . Marital status: Single    Spouse name: N/A  . Number of children: N/A  . Years of education: N/A   Occupational History  . Not on file.   Social History Main Topics  . Smoking status: Former Smoker    Packs/day: 1.00    Years: 28.00    Types: Cigarettes    Start date: 07/27/1970    Quit date: 04/19/1998  . Smokeless tobacco: Never Used  . Alcohol use Not on file  . Drug use: Unknown  . Sexual activity: Not on file   Other Topics Concern  . Not on  file   Social History Narrative  . No narrative on file     Vitals:   10/11/16 1111  BP: 129/77  Pulse: 82  SpO2: 95%  Weight: 185 lb (83.9 kg)  Height: 5\' 7"  (1.702 m)    Wt Readings from Last 3 Encounters:  10/11/16 185 lb (83.9 kg)  04/07/16 194 lb (88 kg)  03/24/16 191 lb (86.6 kg)     PHYSICAL EXAM General: NAD HEENT: Normal. Neck: No JVD, no thyromegaly. Lungs: Clear to auscultation bilaterally with normal respiratory effort. CV: Nondisplaced PMI.  Regular rate and rhythm, normal S1/S2, no S3/S4, no murmur. No pretibial or periankle edema.  No carotid bruit.   Abdomen: Soft, nontender, no distention.  Neurologic: Alert and oriented.  Psych: Normal affect. Skin: Normal. Musculoskeletal: No gross deformities.    ECG: Most recent ECG  reviewed.   Labs: Lab Results  Component Value Date/Time   ALT 24 09/16/2011 02:33 PM   HGB 14.6 09/16/2011 02:33 PM     Lipids: No results found for: LDLCALC, LDLDIRECT, CHOL, TRIG, HDL     ASSESSMENT AND PLAN:  1. CAD: Symptomatically stable. I will obtain a copy of ECG and lipids from PCP. Continue aspirin, metoprolol, and pravastatin. Mild to moderate nonobstructive disease in 2003 with normal stress test in 03/2012. Strong family h/o heart disease and sudden cardiac death. Continue risk factor modification with diet and exercise.  2. Hyperlipidemia: I will obtain a copy of lipids from PCP. Continue pravastatin.    Disposition: Follow up 1 yr  Kate Sable, M.D., F.A.C.C.

## 2017-03-30 ENCOUNTER — Other Ambulatory Visit: Payer: Self-pay | Admitting: Orthopaedic Surgery

## 2017-05-02 DIAGNOSIS — L570 Actinic keratosis: Secondary | ICD-10-CM | POA: Diagnosis not present

## 2017-05-02 DIAGNOSIS — E663 Overweight: Secondary | ICD-10-CM | POA: Diagnosis not present

## 2017-05-02 DIAGNOSIS — Z6827 Body mass index (BMI) 27.0-27.9, adult: Secondary | ICD-10-CM | POA: Diagnosis not present

## 2017-05-02 DIAGNOSIS — Z1389 Encounter for screening for other disorder: Secondary | ICD-10-CM | POA: Diagnosis not present

## 2017-05-02 DIAGNOSIS — G4700 Insomnia, unspecified: Secondary | ICD-10-CM | POA: Diagnosis not present

## 2017-05-16 DIAGNOSIS — D225 Melanocytic nevi of trunk: Secondary | ICD-10-CM | POA: Diagnosis not present

## 2017-05-16 DIAGNOSIS — L570 Actinic keratosis: Secondary | ICD-10-CM | POA: Diagnosis not present

## 2017-05-16 DIAGNOSIS — X32XXXD Exposure to sunlight, subsequent encounter: Secondary | ICD-10-CM | POA: Diagnosis not present

## 2017-05-16 DIAGNOSIS — D485 Neoplasm of uncertain behavior of skin: Secondary | ICD-10-CM | POA: Diagnosis not present

## 2017-05-16 DIAGNOSIS — Z1283 Encounter for screening for malignant neoplasm of skin: Secondary | ICD-10-CM | POA: Diagnosis not present

## 2017-08-08 DIAGNOSIS — H40033 Anatomical narrow angle, bilateral: Secondary | ICD-10-CM | POA: Diagnosis not present

## 2017-08-08 DIAGNOSIS — H1013 Acute atopic conjunctivitis, bilateral: Secondary | ICD-10-CM | POA: Diagnosis not present

## 2017-08-09 DIAGNOSIS — H8103 Meniere's disease, bilateral: Secondary | ICD-10-CM | POA: Diagnosis not present

## 2017-08-09 DIAGNOSIS — F419 Anxiety disorder, unspecified: Secondary | ICD-10-CM | POA: Diagnosis not present

## 2017-08-09 DIAGNOSIS — B1920 Unspecified viral hepatitis C without hepatic coma: Secondary | ICD-10-CM | POA: Diagnosis not present

## 2017-08-09 DIAGNOSIS — E663 Overweight: Secondary | ICD-10-CM | POA: Diagnosis not present

## 2017-08-09 DIAGNOSIS — Z0001 Encounter for general adult medical examination with abnormal findings: Secondary | ICD-10-CM | POA: Diagnosis not present

## 2017-08-09 DIAGNOSIS — Z1389 Encounter for screening for other disorder: Secondary | ICD-10-CM | POA: Diagnosis not present

## 2017-08-09 DIAGNOSIS — I951 Orthostatic hypotension: Secondary | ICD-10-CM | POA: Diagnosis not present

## 2017-08-09 DIAGNOSIS — G4731 Primary central sleep apnea: Secondary | ICD-10-CM | POA: Diagnosis not present

## 2017-08-09 DIAGNOSIS — E782 Mixed hyperlipidemia: Secondary | ICD-10-CM | POA: Diagnosis not present

## 2017-08-09 DIAGNOSIS — R7309 Other abnormal glucose: Secondary | ICD-10-CM | POA: Diagnosis not present

## 2017-08-09 DIAGNOSIS — I2510 Atherosclerotic heart disease of native coronary artery without angina pectoris: Secondary | ICD-10-CM | POA: Diagnosis not present

## 2017-08-09 DIAGNOSIS — Z6828 Body mass index (BMI) 28.0-28.9, adult: Secondary | ICD-10-CM | POA: Diagnosis not present

## 2017-09-05 DIAGNOSIS — G4730 Sleep apnea, unspecified: Secondary | ICD-10-CM | POA: Diagnosis not present

## 2017-10-06 ENCOUNTER — Other Ambulatory Visit: Payer: Self-pay | Admitting: Orthopaedic Surgery

## 2017-10-07 NOTE — Telephone Encounter (Signed)
Can you send in the Allopurinol for Dr Luna Glasgow?

## 2017-10-10 DIAGNOSIS — G4733 Obstructive sleep apnea (adult) (pediatric): Secondary | ICD-10-CM | POA: Diagnosis not present

## 2017-11-24 DIAGNOSIS — H25813 Combined forms of age-related cataract, bilateral: Secondary | ICD-10-CM | POA: Diagnosis not present

## 2017-12-29 DIAGNOSIS — H25811 Combined forms of age-related cataract, right eye: Secondary | ICD-10-CM | POA: Diagnosis not present

## 2017-12-29 NOTE — Patient Instructions (Signed)
Your procedure is scheduled on: 01/06/2018  Report to [facility] at  645   AM.  Call this number if you have problems the morning of surgery: [phone]   Do not eat food or drink liquids :After Midnight.      Take these medicines the morning of surgery with A SIP OF WATER: allopurinol, metorpolol, traimterene, sildenafil.   Do not wear jewelry, make-up or nail polish.  Do not wear lotions, powders, or perfumes. You may wear deodorant.  Do not shave 48 hours prior to surgery.  Do not bring valuables to the hospital.  Contacts, dentures or bridgework may not be worn into surgery.  Leave suitcase in the car. After surgery it may be brought to your room.  For patients admitted to the hospital, checkout time is 11:00 AM the day of discharge.   Patients discharged the day of surgery will not be allowed to drive home.  :     Please read over the following fact sheets that you were given: Coughing and Deep Breathing, Surgical Site Infection Prevention, Anesthesia Post-op Instructions and Care and Recovery After Surgery    Cataract A cataract is a clouding of the lens of the eye. When a lens becomes cloudy, vision is reduced based on the degree and nature of the clouding. Many cataracts reduce vision to some degree. Some cataracts make people more near-sighted as they develop. Other cataracts increase glare. Cataracts that are ignored and become worse can sometimes look white. The white color can be seen through the pupil. CAUSES   Aging. However, cataracts may occur at any age, even in newborns.   Certain drugs.   Trauma to the eye.   Certain diseases such as diabetes.   Specific eye diseases such as chronic inflammation inside the eye or a sudden attack of a rare form of glaucoma.   Inherited or acquired medical problems.  SYMPTOMS   Gradual, progressive drop in vision in the affected eye.   Severe, rapid visual loss. This most often happens when trauma is the cause.  DIAGNOSIS    To detect a cataract, an eye doctor examines the lens. Cataracts are best diagnosed with an exam of the eyes with the pupils enlarged (dilated) by drops.  TREATMENT  For an early cataract, vision may improve by using different eyeglasses or stronger lighting. If that does not help your vision, surgery is the only effective treatment. A cataract needs to be surgically removed when vision loss interferes with your everyday activities, such as driving, reading, or watching TV. A cataract may also have to be removed if it prevents examination or treatment of another eye problem. Surgery removes the cloudy lens and usually replaces it with a substitute lens (intraocular lens, IOL).  At a time when both you and your doctor agree, the cataract will be surgically removed. If you have cataracts in both eyes, only one is usually removed at a time. This allows the operated eye to heal and be out of danger from any possible problems after surgery (such as infection or poor wound healing). In rare cases, a cataract may be doing damage to your eye. In these cases, your caregiver may advise surgical removal right away. The vast majority of people who have cataract surgery have better vision afterward. HOME CARE INSTRUCTIONS  If you are not planning surgery, you may be asked to do the following:  Use different eyeglasses.   Use stronger or brighter lighting.   Ask your eye doctor about  reducing your medicine dose or changing medicines if it is thought that a medicine caused your cataract. Changing medicines does not make the cataract go away on its own.   Become familiar with your surroundings. Poor vision can lead to injury. Avoid bumping into things on the affected side. You are at a higher risk for tripping or falling.   Exercise extreme care when driving or operating machinery.   Wear sunglasses if you are sensitive to bright light or experiencing problems with glare.  SEEK IMMEDIATE MEDICAL CARE IF:   You  have a worsening or sudden vision loss.   You notice redness, swelling, or increasing pain in the eye.   You have a fever.  Document Released: 04/05/2005 Document Revised: 03/25/2011 Document Reviewed: 11/27/2010 [facility] Patient Information 2012 [place].PATIENT INSTRUCTIONS POST-ANESTHESIA  IMMEDIATELY FOLLOWING SURGERY:  Do not drive or operate machinery for the first twenty four hours after surgery.  Do not make any important decisions for twenty four hours after surgery or while taking narcotic pain medications or sedatives.  If you develop intractable nausea and vomiting or a severe headache please notify your doctor immediately.  FOLLOW-UP:  Please make an appointment with your surgeon as instructed. You do not need to follow up with anesthesia unless specifically instructed to do so.  WOUND CARE INSTRUCTIONS (if applicable):  Keep a dry clean dressing on the anesthesia/puncture wound site if there is drainage.  Once the wound has quit draining you may leave it open to air.  Generally you should leave the bandage intact for twenty four hours unless there is drainage.  If the epidural site drains for more than 36-48 hours please call the anesthesia department.  QUESTIONS?:  Please feel free to call your physician or the hospital operator if you have any questions, and they will be happy to assist you.

## 2018-01-02 ENCOUNTER — Ambulatory Visit (INDEPENDENT_AMBULATORY_CARE_PROVIDER_SITE_OTHER): Payer: PPO | Admitting: Cardiovascular Disease

## 2018-01-02 ENCOUNTER — Other Ambulatory Visit: Payer: Self-pay

## 2018-01-02 ENCOUNTER — Encounter (HOSPITAL_COMMUNITY): Payer: Self-pay

## 2018-01-02 ENCOUNTER — Encounter: Payer: Self-pay | Admitting: Cardiovascular Disease

## 2018-01-02 ENCOUNTER — Encounter (HOSPITAL_COMMUNITY)
Admission: RE | Admit: 2018-01-02 | Discharge: 2018-01-02 | Disposition: A | Discharge status: Home / Self Care | Payer: PPO | Source: Ambulatory Visit | Attending: Ophthalmology | Admitting: Ophthalmology

## 2018-01-02 VITALS — BP 116/73 | HR 75 | Ht 67.0 in | Wt 183.2 lb

## 2018-01-02 DIAGNOSIS — H2511 Age-related nuclear cataract, right eye: Secondary | ICD-10-CM | POA: Diagnosis not present

## 2018-01-02 DIAGNOSIS — I440 Atrioventricular block, first degree: Secondary | ICD-10-CM | POA: Diagnosis not present

## 2018-01-02 DIAGNOSIS — Z8249 Family history of ischemic heart disease and other diseases of the circulatory system: Secondary | ICD-10-CM

## 2018-01-02 DIAGNOSIS — I2583 Coronary atherosclerosis due to lipid rich plaque: Secondary | ICD-10-CM | POA: Diagnosis not present

## 2018-01-02 DIAGNOSIS — I2510 Atherosclerotic heart disease of native coronary artery without angina pectoris: Secondary | ICD-10-CM | POA: Diagnosis not present

## 2018-01-02 DIAGNOSIS — E782 Mixed hyperlipidemia: Secondary | ICD-10-CM

## 2018-01-02 DIAGNOSIS — R002 Palpitations: Secondary | ICD-10-CM

## 2018-01-02 DIAGNOSIS — Z01818 Encounter for other preprocedural examination: Secondary | ICD-10-CM | POA: Insufficient documentation

## 2018-01-02 LAB — CBC WITH DIFFERENTIAL/PLATELET
BASOS PCT: 2 %
Basophils Absolute: 0.1 10*3/uL (ref 0.0–0.1)
EOS ABS: 0.3 10*3/uL (ref 0.0–0.7)
EOS PCT: 5 %
HCT: 43.3 % (ref 39.0–52.0)
Hemoglobin: 15.8 g/dL (ref 13.0–17.0)
Lymphocytes Relative: 31 %
Lymphs Abs: 1.9 10*3/uL (ref 0.7–4.0)
MCH: 32.8 pg (ref 26.0–34.0)
MCHC: 36.5 g/dL — AB (ref 30.0–36.0)
MCV: 90 fL (ref 78.0–100.0)
Monocytes Absolute: 0.8 10*3/uL (ref 0.1–1.0)
Monocytes Relative: 14 %
NEUTROS PCT: 48 %
Neutro Abs: 2.9 10*3/uL (ref 1.7–7.7)
PLATELETS: 255 10*3/uL (ref 150–400)
RBC: 4.81 MIL/uL (ref 4.22–5.81)
RDW: 12.2 % (ref 11.5–15.5)
WBC: 6 10*3/uL (ref 4.0–10.5)

## 2018-01-02 LAB — BASIC METABOLIC PANEL
Anion gap: 13 (ref 5–15)
BUN: 9 mg/dL (ref 8–23)
CO2: 22 mmol/L (ref 22–32)
CREATININE: 0.89 mg/dL (ref 0.61–1.24)
Calcium: 9.3 mg/dL (ref 8.9–10.3)
Chloride: 103 mmol/L (ref 98–111)
Glucose, Bld: 117 mg/dL — ABNORMAL HIGH (ref 70–99)
Potassium: 3.3 mmol/L — ABNORMAL LOW (ref 3.5–5.1)
SODIUM: 138 mmol/L (ref 135–145)

## 2018-01-02 NOTE — Progress Notes (Signed)
SUBJECTIVE: Patient presents for follow-up of nonobstructive coronary artery disease. He also has a history of hyperlipidemia, prostate cancer s/p radical prostatectomy, and hepatitis C.   Coronary angiography in June 2003 demonstrated mid LAD 30% stenosis and distal LAD 30% stenosis, mild diffuse 30% stenosis in the circumflex, 30-40% PDA stenosis and 20% stenosis in the RCA. Left ventricular systolic function was normal.  He saw Dr. Debara Pickett in January 2014 for chest pain which was deemed noncardiac in etiology. He reportedly underwent a nuclear stress test which was normal on 04/04/2012, with a calculated LVEF of 72%.  I personally reviewed the ECG performed today which demonstrates sinus rhythm with first-degree AV block, PR interval 212 ms.  The patient denies any symptoms of chest pain, shortness of breath, lightheadedness, dizziness, leg swelling, orthopnea, PND, and syncope.  He very seldom has palpitations which are short-lived.  He has a history of orthostatic hypotension.  He exercises at the [facility] about 3 days/week and exercises between 2 and 2-1/2 hours at a time.  He also kayaks about 10 miles per week with friends.  He has no exercise limiting symptoms whatsoever.   Review of Systems: As per "subjective", otherwise negative.  No Known Allergies  Current Outpatient Medications  Medication Sig Dispense Refill  . allopurinol (ZYLOPRIM) 300 MG tablet TAKE 1 TABLET BY MOUTH ONCE DAILY (Patient taking differently: Take 300 mg by mouth daily. ) 90 tablet 1  . Coenzyme Q10 (COQ10) 100 MG CAPS Take 1 capsule by mouth daily.    . Melatonin 10 MG TABS Take 10 mg by mouth at bedtime.    . metoprolol succinate (TOPROL XL) 25 MG 24 hr tablet Take 1 tablet (25 mg total) by mouth daily. 10 tablet 0  . Multiple Vitamin (MULTIVITAMIN) tablet Take 1 tablet by mouth daily.    . Omega-3 Fatty Acids (FISH OIL) 1200 MG CAPS Take 1 capsule by mouth 2 (two) times daily.    . Potassium  Gluconate 595 MG TBCR Take 1 tablet by mouth daily.    . pravastatin (PRAVACHOL) 20 MG tablet Take 1 tablet (20 mg total) by mouth daily. (Patient taking differently: Take 20 mg by mouth every evening. ) 15 tablet 0  . rOPINIRole (REQUIP) 0.5 MG tablet Take 0.5 mg by mouth at bedtime.    . sildenafil (REVATIO) 20 MG tablet Take 20 mg by mouth.    . triamterene-hydrochlorothiazide (MAXZIDE-25) 37.5-25 MG tablet Take 1 tablet by mouth daily.    . vitamin C (ASCORBIC ACID) 500 MG tablet Take 500 mg by mouth daily.    Marland Kitchen zolpidem (AMBIEN) 10 MG tablet Take 10 mg by mouth at bedtime.     No current facility-administered medications for this visit.     Past Medical History:  Diagnosis Date  . CAD (coronary artery disease)    nuclear stress test 04/04/12 EF 72% with no evidence of ischemia, normal wall motion  . Dyslipidemia   . Erectile dysfunction   . FH: prostate cancer 2006   s/p radical protatectomy  . Hepatitis C   . Hypertension   . Meniere disease    w/bilateral hearing aids  . Pneumonia     Past Surgical History:  Procedure Laterality Date  . CARDIAC CATHETERIZATION  2003   mild to moderate by cath  . INGUINAL HERNIA REPAIR     age 20  . OTHER SURGICAL HISTORY     tracheotomy as a child  . PROSTATECTOMY  Social History   Socioeconomic History  . Marital status: Single    Spouse name: Not on file  . Number of children: Not on file  . Years of education: Not on file  . Highest education level: Not on file  Occupational History  . Not on file  Social Needs  . Financial resource strain: Not on file  . Food insecurity:    Worry: Not on file    Inability: Not on file  . Transportation needs:    Medical: Not on file    Non-medical: Not on file  Tobacco Use  . Smoking status: Former Smoker    Packs/day: 1.00    Years: 28.00    Pack years: 28.00    Types: Cigarettes    Start date: 07/27/1970    Last attempt to quit: 04/19/1998    Years since quitting: 19.7  .  Smokeless tobacco: Never Used  Substance and Sexual Activity  . Alcohol use: Yes    Alcohol/week: 0.0 standard drinks  . Drug use: Yes    Types: Marijuana  . Sexual activity: Not on file  Lifestyle  . Physical activity:    Days per week: Not on file    Minutes per session: Not on file  . Stress: Not on file  Relationships  . Social connections:    Talks on phone: Not on file    Gets together: Not on file    Attends religious service: Not on file    Active member of club or organization: Not on file    Attends meetings of clubs or organizations: Not on file    Relationship status: Not on file  . Intimate partner violence:    Fear of current or ex partner: Not on file    Emotionally abused: Not on file    Physically abused: Not on file    Forced sexual activity: Not on file  Other Topics Concern  . Not on file  Social History Narrative  . Not on file     Vitals:   01/02/18 1439  BP: 116/73  Pulse: 75  Weight: 183 lb 3.2 oz (83.1 kg)  Height: 5\' 7"  (1.702 m)    Wt Readings from Last 3 Encounters:  01/02/18 183 lb 3.2 oz (83.1 kg)  01/02/18 178 lb (80.7 kg)  10/11/16 185 lb (83.9 kg)     PHYSICAL EXAM General: NAD HEENT: Normal. Neck: No JVD, no thyromegaly. Lungs: Clear to auscultation bilaterally with normal respiratory effort. CV: Regular rate and rhythm, normal S1/S2, no S3/S4, no murmur. No pretibial or periankle edema.  No carotid bruit.   Abdomen: Soft, nontender, no distention.  Neurologic: Alert and oriented.  Psych: Normal affect. Skin: Normal. Musculoskeletal: No gross deformities.    ECG: Reviewed above under Subjective   Labs: Lab Results  Component Value Date/Time   K 3.3 (L) 01/02/2018 10:40 AM   BUN 9 01/02/2018 10:40 AM   CREATININE 0.89 01/02/2018 10:40 AM   ALT 24 09/16/2011 02:33 PM   HGB 15.8 01/02/2018 10:40 AM     Lipids: No results found for: LDLCALC, LDLDIRECT, CHOL, TRIG, HDL     ASSESSMENT AND PLAN:  1. CAD:  Symptomatically stable. Continue aspirin, metoprolol, and pravastatin. Mild to moderate nonobstructive disease in 2003 with normal stress test in 03/2012. Strong family h/o heart disease and sudden cardiac death. Continue risk factor modification with diet and exercise.  2. Hyperlipidemia: I will obtain a copy of lipids from PCP. Continue pravastatin.  3.  Palpitations: Seldom in occurrence.  If they were to occur for longer periods of time with increasing frequency, I would consider event monitoring.  Continue Toprol-XL at present dose.  4.  Preoperative risk stratification: He is scheduled to undergo cataract surgery this week.  It by itself is a low risk procedure.  He does not require any cardiac testing before hand.  He is symptomatically stable from the standpoint.  He has very minor first-degree AV block which is inconsequential.   Disposition: Follow up 1 year.   Kate Sable, M.D., F.A.C.C.

## 2018-01-02 NOTE — Patient Instructions (Signed)

## 2018-01-06 ENCOUNTER — Encounter (HOSPITAL_COMMUNITY)
Admission: RE | Disposition: A | Discharge status: Home / Self Care | Payer: Self-pay | Source: Ambulatory Visit | Attending: Ophthalmology

## 2018-01-06 ENCOUNTER — Ambulatory Visit (HOSPITAL_COMMUNITY)
Admission: RE | Admit: 2018-01-06 | Discharge: 2018-01-06 | Disposition: A | Discharge status: Home / Self Care | Payer: PPO | Source: Ambulatory Visit | Attending: Ophthalmology | Admitting: Ophthalmology

## 2018-01-06 ENCOUNTER — Ambulatory Visit (HOSPITAL_COMMUNITY): Payer: PPO | Admitting: Anesthesiology

## 2018-01-06 ENCOUNTER — Encounter (HOSPITAL_COMMUNITY): Payer: Self-pay

## 2018-01-06 DIAGNOSIS — H2511 Age-related nuclear cataract, right eye: Secondary | ICD-10-CM | POA: Diagnosis not present

## 2018-01-06 DIAGNOSIS — H269 Unspecified cataract: Secondary | ICD-10-CM | POA: Insufficient documentation

## 2018-01-06 DIAGNOSIS — Z8546 Personal history of malignant neoplasm of prostate: Secondary | ICD-10-CM | POA: Insufficient documentation

## 2018-01-06 DIAGNOSIS — I2510 Atherosclerotic heart disease of native coronary artery without angina pectoris: Secondary | ICD-10-CM | POA: Insufficient documentation

## 2018-01-06 DIAGNOSIS — Z79899 Other long term (current) drug therapy: Secondary | ICD-10-CM | POA: Insufficient documentation

## 2018-01-06 DIAGNOSIS — Z8619 Personal history of other infectious and parasitic diseases: Secondary | ICD-10-CM | POA: Insufficient documentation

## 2018-01-06 DIAGNOSIS — H8109 Meniere's disease, unspecified ear: Secondary | ICD-10-CM | POA: Insufficient documentation

## 2018-01-06 DIAGNOSIS — I10 Essential (primary) hypertension: Secondary | ICD-10-CM | POA: Diagnosis not present

## 2018-01-06 DIAGNOSIS — H52201 Unspecified astigmatism, right eye: Secondary | ICD-10-CM | POA: Diagnosis not present

## 2018-01-06 DIAGNOSIS — Z87891 Personal history of nicotine dependence: Secondary | ICD-10-CM | POA: Diagnosis not present

## 2018-01-06 DIAGNOSIS — H25811 Combined forms of age-related cataract, right eye: Secondary | ICD-10-CM | POA: Diagnosis not present

## 2018-01-06 HISTORY — PX: CATARACT EXTRACTION W/PHACO: SHX586

## 2018-01-06 SURGERY — PHACOEMULSIFICATION, CATARACT, WITH IOL INSERTION
Anesthesia: Monitor Anesthesia Care | Site: Eye | Laterality: Right

## 2018-01-06 MED ORDER — LACTATED RINGERS IV SOLN
INTRAVENOUS | Status: DC
  Administered 2018-01-06: 08:00:00 via INTRAVENOUS

## 2018-01-06 MED ORDER — MIDAZOLAM HCL 2 MG/2ML IJ SOLN
INTRAMUSCULAR | Status: AC
  Filled 2018-01-06: qty 2

## 2018-01-06 MED ORDER — LIDOCAINE HCL 3.5 % OP GEL
1.0000 "application " | Freq: Once | OPHTHALMIC | Status: AC
  Administered 2018-01-06: 1 via OPHTHALMIC

## 2018-01-06 MED ORDER — TETRACAINE HCL 0.5 % OP SOLN
1.0000 [drp] | OPHTHALMIC | Status: AC
  Administered 2018-01-06 (×3): 1 [drp] via OPHTHALMIC

## 2018-01-06 MED ORDER — EPINEPHRINE PF 1 MG/ML IJ SOLN
INTRAOCULAR | Status: DC | PRN
  Administered 2018-01-06: 500 mL

## 2018-01-06 MED ORDER — LIDOCAINE HCL (PF) 1 % IJ SOLN
INTRAOCULAR | Status: DC | PRN
  Administered 2018-01-06: .9 mL via OPHTHALMIC

## 2018-01-06 MED ORDER — CYCLOPENTOLATE-PHENYLEPHRINE 0.2-1 % OP SOLN
1.0000 [drp] | OPHTHALMIC | Status: AC
  Administered 2018-01-06 (×3): 1 [drp] via OPHTHALMIC

## 2018-01-06 MED ORDER — BSS IO SOLN
INTRAOCULAR | Status: DC | PRN
  Administered 2018-01-06: 15 mL via INTRAOCULAR

## 2018-01-06 MED ORDER — NEOMYCIN-POLYMYXIN-DEXAMETH 3.5-10000-0.1 OP SUSP
OPHTHALMIC | Status: DC | PRN
  Administered 2018-01-06: 2 [drp] via OPHTHALMIC

## 2018-01-06 MED ORDER — PROVISC 10 MG/ML IO SOLN
INTRAOCULAR | Status: DC | PRN
  Administered 2018-01-06: 0.85 mL via INTRAOCULAR

## 2018-01-06 MED ORDER — MIDAZOLAM HCL 5 MG/5ML IJ SOLN
INTRAMUSCULAR | Status: DC | PRN
  Administered 2018-01-06: 2 mg via INTRAVENOUS

## 2018-01-06 MED ORDER — SODIUM HYALURONATE 23 MG/ML IO SOLN
INTRAOCULAR | Status: DC | PRN
  Administered 2018-01-06: 0.6 mL via INTRAOCULAR

## 2018-01-06 MED ORDER — PHENYLEPHRINE HCL 2.5 % OP SOLN
1.0000 [drp] | OPHTHALMIC | Status: AC
  Administered 2018-01-06 (×3): 1 [drp] via OPHTHALMIC

## 2018-01-06 MED ORDER — POVIDONE-IODINE 5 % OP SOLN
OPHTHALMIC | Status: DC | PRN
  Administered 2018-01-06: 1 via OPHTHALMIC

## 2018-01-06 SURGICAL SUPPLY — 19 items
CLOTH BEACON ORANGE TIMEOUT ST (SAFETY) ×2 IMPLANT
EYE SHIELD UNIVERSAL CLEAR (GAUZE/BANDAGES/DRESSINGS) ×2 IMPLANT
GLOVE BIOGEL PI IND STRL 7.0 (GLOVE) IMPLANT
GLOVE BIOGEL PI IND STRL 7.5 (GLOVE) IMPLANT
GLOVE BIOGEL PI INDICATOR 7.0 (GLOVE) ×4
GLOVE BIOGEL PI INDICATOR 7.5 (GLOVE) ×2
GOWN STRL REUS W/TWL LRG LVL3 (GOWN DISPOSABLE) ×2 IMPLANT
LENS IOL MULTIFOCL TECNIS 18.5 (Intraocular Lens) ×3 IMPLANT
LENS IOL TECNIS MF 3P 18.5 (Intraocular Lens) IMPLANT
NDL HYPO 18GX1.5 BLUNT FILL (NEEDLE) IMPLANT
NEEDLE HYPO 18GX1.5 BLUNT FILL (NEEDLE) ×3 IMPLANT
PAD ARMBOARD 7.5X6 YLW CONV (MISCELLANEOUS) ×2 IMPLANT
PROC W SPEC LENS (INTRAOCULAR LENS) ×3
PROCESS W SPEC LENS (INTRAOCULAR LENS) IMPLANT
SYR TB 1ML LL NO SAFETY (SYRINGE) ×2 IMPLANT
TAPE SURG TRANSPORE 1 IN (GAUZE/BANDAGES/DRESSINGS) IMPLANT
TAPE SURGICAL TRANSPORE 1 IN (GAUZE/BANDAGES/DRESSINGS) ×2
VISCOELASTIC ADDITIONAL (OPHTHALMIC RELATED) ×2 IMPLANT
WATER STERILE IRR 250ML POUR (IV SOLUTION) ×2 IMPLANT

## 2018-01-06 NOTE — Discharge Instructions (Addendum)
Please discharge patient when stable, will follow up today with Dr. Wrzosek at the [facility] office immediately following discharge.  Leave shield in place until visit.  All paperwork with discharge instructions will be given at the office. ° ° °PATIENT INSTRUCTIONS °POST-ANESTHESIA ° °IMMEDIATELY FOLLOWING SURGERY:  Do not drive or operate machinery for the first twenty four hours after surgery.  Do not make any important decisions for twenty four hours after surgery or while taking narcotic pain medications or sedatives.  If you develop intractable nausea and vomiting or a severe headache please notify your doctor immediately. ° °FOLLOW-UP:  Please make an appointment with your surgeon as instructed. You do not need to follow up with anesthesia unless specifically instructed to do so. ° °WOUND CARE INSTRUCTIONS (if applicable):  Keep a dry clean dressing on the anesthesia/puncture wound site if there is drainage.  Once the wound has quit draining you may leave it open to air.  Generally you should leave the bandage intact for twenty four hours unless there is drainage.  If the epidural site drains for more than 36-48 hours please call the anesthesia department. ° °QUESTIONS?:  Please feel free to call your physician or the hospital operator if you have any questions, and they will be happy to assist you.    ° ° ° °

## 2018-01-06 NOTE — Op Note (Addendum)
Date of procedure: 01/06/18  Pre-operative diagnosis: Visually significant cataract, Right Eye; Visually Significant Astigmatism, Right Eye (H25.811)  Post-operative diagnosis: Visually significant cataract, Right Eye; Visually Significant Astigmatism, Right Eye  Procedure: Removal of cataract via phacoemulsification and insertion of intra-ocular lens AMO ZXT150 +21.5D into the capsular bag of the Right Eye  Attending surgeon: Gerda Diss. Samel Bruna, MD, MA  Anesthesia: MAC, Topical Akten  Complications: None  Estimated Blood Loss: <67m (minimal)  Specimens: None  Implants: As above  Indications:  Visually significant cataract, Right Eye; Visually Significant Astigmatism, Right Eye  Procedure:  The patient was seen and identified in the pre-operative area. The operative eye was identified and dilated.  The operative eye was marked.  Pre-operative toric markers were used to mark the eye at 0 and 180 degrees. Topical anesthesia was administered to the operative eye.     The patient was then to the operative suite and placed in the supine position.  A timeout was performed confirming the patient, procedure to be performed, and all other relevant information.   The patient's face was prepped and draped in the usual fashion for intra-ocular surgery.  A lid speculum was placed into the operative eye and the surgical microscope moved into place and focused.  A superotemporal paracentesis was created using a 20 gauge paracentesis blade.  Shugarcaine was injected into the anterior chamber.  Viscoelastic was injected into the anterior chamber.  A temporal clear-corneal main wound incision was created using a 2.465mmicrokeratome.  A continuous curvilinear capsulorrhexis was initiated using an irrigating cystitome and completed using capsulorrhexis forceps.  Hydrodissection and hydrodeliniation were performed.  Viscoelastic was injected into the anterior chamber.  A phacoemulsification handpiece and a chopper  as a second instrument were used to remove the nucleus and epinucleus. The irrigation/aspiration handpiece was used to remove any remaining cortical material.   The capsular bag was reinflated with viscoelastic, checked, and found to be intact.  The intraocular lens was inserted into the capsular bag and dialed into place using a Kuglen hook to 001 degree.  The irrigation/aspiration handpiece was used to remove any remaining viscoelastic.  The clear corneal wound and paracentesis wounds were then hydrated and checked with Weck-Cels to be watertight.  The lid-speculum and drape was removed, and the patient's face was cleaned with a wet and dry 4x4.  Maxitrol was instilled in the eye before a clear shield was taped over the eye. The patient was taken to the post-operative care unit in good condition, having tolerated the procedure well.  Post-Op Instructions: [facility] and will receive all other orders and instructions.

## 2018-01-06 NOTE — Anesthesia Postprocedure Evaluation (Signed)
Anesthesia Post Note  Patient: Theodore Hart  Procedure(s) Performed: CATARACT EXTRACTION PHACO AND INTRAOCULAR LENS PLACEMENT (IOC) (Right Eye)  Patient location during evaluation: Short Stay Anesthesia Type: MAC Level of consciousness: awake and alert and oriented Pain management: pain level controlled Vital Signs Assessment: post-procedure vital signs reviewed and stable Respiratory status: spontaneous breathing Cardiovascular status: stable and blood pressure returned to baseline Postop Assessment: no apparent nausea or vomiting Anesthetic complications: no     Last Vitals:  Vitals:   01/06/18 0725  BP: 120/78  Pulse: 73  Temp: 36.6 C  SpO2: 94%    Last Pain:  Vitals:   01/06/18 0725  TempSrc: Oral  PainSc: 0-No pain                 Celia Friedland

## 2018-01-06 NOTE — Anesthesia Preprocedure Evaluation (Signed)
Anesthesia Evaluation  Patient identified by MRN, date of birth, ID band Patient awake    Reviewed: Allergy & Precautions, H&P , NPO status , Patient's Chart, lab work & pertinent test results, reviewed documented beta blocker date and time   Airway Mallampati: II  TM Distance: >3 FB Neck ROM: full    Dental no notable dental hx.    Pulmonary neg pulmonary ROS, pneumonia, former smoker,    Pulmonary exam normal breath sounds clear to auscultation       Cardiovascular Exercise Tolerance: Good hypertension, + CAD  negative cardio ROS   Rhythm:regular Rate:Normal     Neuro/Psych negative neurological ROS  negative psych ROS   GI/Hepatic negative GI ROS, Neg liver ROS, (+) Hepatitis -, C  Endo/Other  negative endocrine ROS  Renal/GU negative Renal ROS    prostate cancer negative genitourinary   Musculoskeletal   Abdominal   Peds  Hematology negative hematology ROS (+)   Anesthesia Other Findings   Reproductive/Obstetrics negative OB ROS                             Anesthesia Physical Anesthesia Plan  ASA: III  Anesthesia Plan: MAC   Post-op Pain Management:    Induction:   PONV Risk Score and Plan:   Airway Management Planned:   Additional Equipment:   Intra-op Plan:   Post-operative Plan:   Informed Consent: I have reviewed the patients History and Physical, chart, labs and discussed the procedure including the risks, benefits and alternatives for the proposed anesthesia with the patient or authorized representative who has indicated his/her understanding and acceptance.     Plan Discussed with: CRNA  Anesthesia Plan Comments:         Anesthesia Quick Evaluation

## 2018-01-06 NOTE — Transfer of Care (Signed)
Immediate Anesthesia Transfer of Care Note  Patient: Theodore Hart  Procedure(s) Performed: CATARACT EXTRACTION PHACO AND INTRAOCULAR LENS PLACEMENT (IOC) (Right Eye)  Patient Location: Short Stay  Anesthesia Type:MAC  Level of Consciousness: awake  Airway & Oxygen Therapy: Patient Spontanous Breathing  Post-op Assessment: Report given to RN  Post vital signs: Reviewed  Last Vitals:  Vitals Value Taken Time  BP    Temp    Pulse    Resp    SpO2      Last Pain:  Vitals:   01/06/18 0725  TempSrc: Oral  PainSc: 0-No pain         Complications: No apparent anesthesia complications

## 2018-01-06 NOTE — H&P (Signed)
The H and P was reviewed and updated. The patient was examined.  No changes were found after exam.  The surgical eye was marked.  

## 2018-01-10 ENCOUNTER — Encounter (HOSPITAL_COMMUNITY): Payer: Self-pay | Admitting: Ophthalmology

## 2018-01-12 DIAGNOSIS — H25812 Combined forms of age-related cataract, left eye: Secondary | ICD-10-CM | POA: Diagnosis not present

## 2018-01-17 ENCOUNTER — Encounter (HOSPITAL_COMMUNITY): Payer: Self-pay

## 2018-01-17 ENCOUNTER — Encounter (HOSPITAL_COMMUNITY)
Admission: RE | Admit: 2018-01-17 | Discharge: 2018-01-17 | Disposition: A | Discharge status: Home / Self Care | Payer: PPO | Source: Ambulatory Visit | Attending: Ophthalmology | Admitting: Ophthalmology

## 2018-01-17 ENCOUNTER — Other Ambulatory Visit (HOSPITAL_COMMUNITY): Payer: PPO

## 2018-01-20 ENCOUNTER — Ambulatory Visit (HOSPITAL_COMMUNITY): Payer: PPO | Admitting: Anesthesiology

## 2018-01-20 ENCOUNTER — Ambulatory Visit (HOSPITAL_COMMUNITY)
Admission: RE | Admit: 2018-01-20 | Discharge: 2018-01-20 | Disposition: A | Discharge status: Home / Self Care | Payer: PPO | Source: Ambulatory Visit | Attending: Ophthalmology | Admitting: Ophthalmology

## 2018-01-20 ENCOUNTER — Encounter (HOSPITAL_COMMUNITY): Payer: Self-pay | Admitting: *Deleted

## 2018-01-20 ENCOUNTER — Encounter (HOSPITAL_COMMUNITY)
Admission: RE | Disposition: A | Discharge status: Home / Self Care | Payer: Self-pay | Source: Ambulatory Visit | Attending: Ophthalmology

## 2018-01-20 ENCOUNTER — Other Ambulatory Visit: Payer: Self-pay

## 2018-01-20 DIAGNOSIS — M109 Gout, unspecified: Secondary | ICD-10-CM | POA: Diagnosis not present

## 2018-01-20 DIAGNOSIS — Z87891 Personal history of nicotine dependence: Secondary | ICD-10-CM | POA: Insufficient documentation

## 2018-01-20 DIAGNOSIS — H52202 Unspecified astigmatism, left eye: Secondary | ICD-10-CM | POA: Insufficient documentation

## 2018-01-20 DIAGNOSIS — I2510 Atherosclerotic heart disease of native coronary artery without angina pectoris: Secondary | ICD-10-CM | POA: Insufficient documentation

## 2018-01-20 DIAGNOSIS — H25812 Combined forms of age-related cataract, left eye: Secondary | ICD-10-CM | POA: Insufficient documentation

## 2018-01-20 DIAGNOSIS — Z8546 Personal history of malignant neoplasm of prostate: Secondary | ICD-10-CM | POA: Insufficient documentation

## 2018-01-20 DIAGNOSIS — Z79899 Other long term (current) drug therapy: Secondary | ICD-10-CM | POA: Diagnosis not present

## 2018-01-20 DIAGNOSIS — H2512 Age-related nuclear cataract, left eye: Secondary | ICD-10-CM | POA: Diagnosis not present

## 2018-01-20 DIAGNOSIS — I10 Essential (primary) hypertension: Secondary | ICD-10-CM | POA: Insufficient documentation

## 2018-01-20 HISTORY — PX: CATARACT EXTRACTION W/PHACO: SHX586

## 2018-01-20 SURGERY — PHACOEMULSIFICATION, CATARACT, WITH IOL INSERTION
Anesthesia: Monitor Anesthesia Care | Site: Eye | Laterality: Left

## 2018-01-20 MED ORDER — NEOMYCIN-POLYMYXIN-DEXAMETH 3.5-10000-0.1 OP SUSP
OPHTHALMIC | Status: DC | PRN
  Administered 2018-01-20: 2 [drp] via OPHTHALMIC

## 2018-01-20 MED ORDER — POVIDONE-IODINE 5 % OP SOLN
OPHTHALMIC | Status: DC | PRN
  Administered 2018-01-20: 1 via OPHTHALMIC

## 2018-01-20 MED ORDER — CYCLOPENTOLATE-PHENYLEPHRINE 0.2-1 % OP SOLN
1.0000 [drp] | OPHTHALMIC | Status: AC | PRN
  Administered 2018-01-20 (×3): 1 [drp] via OPHTHALMIC

## 2018-01-20 MED ORDER — EPINEPHRINE PF 1 MG/ML IJ SOLN
INTRAOCULAR | Status: DC | PRN
  Administered 2018-01-20: 500 mL

## 2018-01-20 MED ORDER — LIDOCAINE HCL 3.5 % OP GEL
1.0000 "application " | Freq: Once | OPHTHALMIC | Status: AC
  Administered 2018-01-20: 1 via OPHTHALMIC

## 2018-01-20 MED ORDER — LACTATED RINGERS IV SOLN
INTRAVENOUS | Status: DC
  Administered 2018-01-20: 08:00:00 via INTRAVENOUS

## 2018-01-20 MED ORDER — MIDAZOLAM HCL 5 MG/5ML IJ SOLN
INTRAMUSCULAR | Status: DC | PRN
  Administered 2018-01-20: 2 mg via INTRAVENOUS

## 2018-01-20 MED ORDER — LIDOCAINE HCL (PF) 1 % IJ SOLN
INTRAOCULAR | Status: DC | PRN
  Administered 2018-01-20: 1 mL via OPHTHALMIC

## 2018-01-20 MED ORDER — PHENYLEPHRINE HCL 2.5 % OP SOLN
1.0000 [drp] | OPHTHALMIC | Status: AC | PRN
  Administered 2018-01-20 (×3): 1 [drp] via OPHTHALMIC

## 2018-01-20 MED ORDER — PROVISC 10 MG/ML IO SOLN
INTRAOCULAR | Status: DC | PRN
  Administered 2018-01-20: 0.85 mL via INTRAOCULAR

## 2018-01-20 MED ORDER — TETRACAINE HCL 0.5 % OP SOLN
1.0000 [drp] | OPHTHALMIC | Status: AC | PRN
  Administered 2018-01-20 (×3): 1 [drp] via OPHTHALMIC

## 2018-01-20 MED ORDER — BSS IO SOLN
INTRAOCULAR | Status: DC | PRN
  Administered 2018-01-20: 15 mL via INTRAOCULAR

## 2018-01-20 MED ORDER — MIDAZOLAM HCL 2 MG/2ML IJ SOLN
INTRAMUSCULAR | Status: AC
  Filled 2018-01-20: qty 2

## 2018-01-20 MED ORDER — SODIUM HYALURONATE 23 MG/ML IO SOLN
INTRAOCULAR | Status: DC | PRN
  Administered 2018-01-20: 0.6 mL via INTRAOCULAR

## 2018-01-20 SURGICAL SUPPLY — 17 items
CLOTH BEACON ORANGE TIMEOUT ST (SAFETY) ×2 IMPLANT
EYE SHIELD UNIVERSAL CLEAR (GAUZE/BANDAGES/DRESSINGS) ×2 IMPLANT
GLOVE BIOGEL PI IND STRL 7.0 (GLOVE) IMPLANT
GLOVE BIOGEL PI INDICATOR 7.0 (GLOVE) ×4
LENS IOL SYMFON TORIC 150 21.0 ×1 IMPLANT
LENS IOL SYMFONY TORIC 21.0 ×3 IMPLANT
LENS IOL SYMFONY TRC 150 21.0 IMPLANT
NDL HYPO 18GX1.5 BLUNT FILL (NEEDLE) IMPLANT
NEEDLE HYPO 18GX1.5 BLUNT FILL (NEEDLE) ×3 IMPLANT
PAD ARMBOARD 7.5X6 YLW CONV (MISCELLANEOUS) ×2 IMPLANT
PROC W SPEC LENS (INTRAOCULAR LENS) ×3
PROCESS W SPEC LENS (INTRAOCULAR LENS) IMPLANT
SYR TB 1ML LL NO SAFETY (SYRINGE) ×2 IMPLANT
TAPE SURG TRANSPORE 1 IN (GAUZE/BANDAGES/DRESSINGS) IMPLANT
TAPE SURGICAL TRANSPORE 1 IN (GAUZE/BANDAGES/DRESSINGS) ×2
VISCOELASTIC ADDITIONAL (OPHTHALMIC RELATED) ×2 IMPLANT
WATER STERILE IRR 250ML POUR (IV SOLUTION) ×2 IMPLANT

## 2018-01-20 NOTE — Anesthesia Postprocedure Evaluation (Signed)
Anesthesia Post Note  Patient: Theodore Hart  Procedure(s) Performed: CATARACT EXTRACTION PHACO AND INTRAOCULAR LENS PLACEMENT LEFT EYE (Left Eye)  Patient location during evaluation: Short Stay Anesthesia Type: MAC Level of consciousness: awake and alert and oriented Pain management: pain level controlled Vital Signs Assessment: post-procedure vital signs reviewed and stable Respiratory status: spontaneous breathing Cardiovascular status: stable Postop Assessment: no apparent nausea or vomiting Anesthetic complications: no     Last Vitals:  Vitals:   01/20/18 0755 01/20/18 0921  BP: 106/67 (!) 136/92  Pulse: 73 85  Resp: 14   Temp: (!) 36.3 C (!) 36.3 C  SpO2: 96% 99%    Last Pain:  Vitals:   01/20/18 0921  TempSrc: Oral  PainSc: 0-No pain                 ADAMS, AMY A

## 2018-01-20 NOTE — Anesthesia Preprocedure Evaluation (Signed)
Anesthesia Evaluation  Patient identified by MRN, date of birth, ID band Patient awake    Reviewed: Allergy & Precautions, H&P , NPO status , Patient's Chart, lab work & pertinent test results, reviewed documented beta blocker date and time   Airway Mallampati: II  TM Distance: >3 FB Neck ROM: full    Dental no notable dental hx.    Pulmonary neg pulmonary ROS, former smoker,    Pulmonary exam normal breath sounds clear to auscultation       Cardiovascular Exercise Tolerance: Good hypertension, On Medications + CAD  negative cardio ROS   Rhythm:regular Rate:Normal     Neuro/Psych negative neurological ROS  negative psych ROS   GI/Hepatic negative GI ROS, Neg liver ROS, (+) C  Endo/Other  negative endocrine ROS  Renal/GU negative Renal ROS  negative genitourinary   Musculoskeletal   Abdominal   Peds  Hematology negative hematology ROS (+)   Anesthesia Other Findings Hearing loss Childhood h/o pneumonia s/p trach and closure Phaco/IOL weeks ago, no issues, no interval changes Request similar anesthetic today  Reproductive/Obstetrics negative OB ROS                             Anesthesia Physical Anesthesia Plan  ASA: II  Anesthesia Plan: MAC   Post-op Pain Management:    Induction:   PONV Risk Score and Plan:   Airway Management Planned:   Additional Equipment:   Intra-op Plan:   Post-operative Plan:   Informed Consent: I have reviewed the patients History and Physical, chart, labs and discussed the procedure including the risks, benefits and alternatives for the proposed anesthesia with the patient or authorized representative who has indicated his/her understanding and acceptance.   Dental Advisory Given  Plan Discussed with: CRNA and Anesthesiologist  Anesthesia Plan Comments:         Anesthesia Quick Evaluation

## 2018-01-20 NOTE — Op Note (Signed)
Date of procedure: 01/20/18  Pre-operative diagnosis: Visually significant cataract, Left Eye; Visually Significant Astigmatism, Left Eye (H25.812)  Post-operative diagnosis: Visually significant cataract, Left Eye; Visually Significant Astigmatism, Left Eye  Procedure: Removal of cataract via phacoemulsification and insertion of intra-ocular lens AMO ZXT +21.0D into the capsular bag of the Left Eye  Attending surgeon: Gerda Diss. Tiberius Loftus, MD, MA  Anesthesia: MAC, Topical Akten  Complications: None  Estimated Blood Loss: <56m (minimal)  Specimens: None  Implants: As above  Indications:  Visually significant cataract, Left Eye; Visually Significant Astigmatism, Left Eye  Procedure:  The patient was seen and identified in the pre-operative area. The operative eye was identified and dilated.  The operative eye was marked.  Pre-operative toric markers were used to mark the eye at 0 and 180 degrees. Topical anesthesia was administered to the operative eye.     The patient was then to the operative suite and placed in the supine position.  A timeout was performed confirming the patient, procedure to be performed, and all other relevant information.   The patient's face was prepped and draped in the usual fashion for intra-ocular surgery.  A lid speculum was placed into the operative eye and the surgical microscope moved into place and focused.  A superotemporal paracentesis was created using a 20 gauge paracentesis blade.  Shugarcaine was injected into the anterior chamber.  Viscoelastic was injected into the anterior chamber.  A temporal clear-corneal main wound incision was created using a 2.450mmicrokeratome.  A continuous curvilinear capsulorrhexis was initiated using an irrigating cystitome and completed using capsulorrhexis forceps.  Hydrodissection and hydrodeliniation were performed.  Viscoelastic was injected into the anterior chamber.  A phacoemulsification handpiece and a chopper as a  second instrument were used to remove the nucleus and epinucleus. The irrigation/aspiration handpiece was used to remove any remaining cortical material.   The capsular bag was reinflated with viscoelastic, checked, and found to be intact.  The intraocular lens was inserted into the capsular bag and dialed into place using a Kuglen hook to 176 degrees.  The irrigation/aspiration handpiece was used to remove any remaining viscoelastic.  The clear corneal wound and paracentesis wounds were then hydrated and checked with Weck-Cels to be watertight.  The lid-speculum and drape was removed, and the patient's face was cleaned with a wet and dry 4x4.  Maxitrol was instilled in the eye before a clear shield was taped over the eye. The patient was taken to the post-operative care unit in good condition, having tolerated the procedure well.  Post-Op Instructions: [facility] and will receive all other orders and instructions.

## 2018-01-20 NOTE — H&P (Signed)
The H and P was reviewed and updated. The patient was examined.  No changes were found after exam.  The surgical eye was marked.  

## 2018-01-20 NOTE — Transfer of Care (Signed)
Immediate Anesthesia Transfer of Care Note  Patient: Theodore Hart  Procedure(s) Performed: CATARACT EXTRACTION PHACO AND INTRAOCULAR LENS PLACEMENT LEFT EYE (Left Eye)  Patient Location: Short Stay  Anesthesia Type:MAC  Level of Consciousness: awake, alert , oriented and patient cooperative  Airway & Oxygen Therapy: Patient Spontanous Breathing  Post-op Assessment: Report given to RN and Post -op Vital signs reviewed and stable  Post vital signs: Reviewed and stable  Last Vitals:  Vitals Value Taken Time  BP    Temp    Pulse    Resp    SpO2      Last Pain:  Vitals:   01/20/18 0755  TempSrc: Oral  PainSc: 0-No pain      Patients Stated Pain Goal: 6 (16/10/96 0454)  Complications: No apparent anesthesia complications

## 2018-01-20 NOTE — Discharge Instructions (Signed)
Please discharge patient when stable, will follow up today with Dr. Dominyk Law at the [facility] office immediately following discharge.  Leave shield in place until visit.  All paperwork with discharge instructions will be given at the office. ° ° °Monitored Anesthesia Care, Care After °These instructions provide you with information about caring for yourself after your procedure. Your health care provider may also give you more specific instructions. Your treatment has been planned according to current medical practices, but problems sometimes occur. Call your health care provider if you have any problems or questions after your procedure. °What can I expect after the procedure? °After your procedure, it is common to: °· Feel sleepy for several hours. °· Feel clumsy and have poor balance for several hours. °· Feel forgetful about what happened after the procedure. °· Have poor judgment for several hours. °· Feel nauseous or vomit. °· Have a sore throat if you had a breathing tube during the procedure. ° °Follow these instructions at home: °For at least 24 hours after the procedure: ° °· Do not: °? Participate in activities in which you could fall or become injured. °? Drive. °? Use heavy machinery. °? Drink alcohol. °? Take sleeping pills or medicines that cause drowsiness. °? Make important decisions or sign legal documents. °? Take care of children on your own. °· Rest. °Eating and drinking °· Follow the diet that is recommended by your health care provider. °· If you vomit, drink water, juice, or soup when you can drink without vomiting. °· Make sure you have little or no nausea before eating solid foods. °General instructions °· Have a responsible adult stay with you until you are awake and alert. °· Take over-the-counter and prescription medicines only as told by your health care provider. °· If you smoke, do not smoke without supervision. °· Keep all follow-up visits as told by your health care  provider. This is important. °Contact a health care provider if: °· You keep feeling nauseous or you keep vomiting. °· You feel light-headed. °· You develop a rash. °· You have a fever. °Get help right away if: °· You have trouble breathing. °This information is not intended to replace advice given to you by your health care provider. Make sure you discuss any questions you have with your health care provider. °Document Released: 07/27/2015 Document Revised: 11/26/2015 Document Reviewed: 07/27/2015 °Elsevier Interactive Patient Education © 2018 Elsevier Inc. ° °

## 2018-01-23 ENCOUNTER — Encounter (HOSPITAL_COMMUNITY): Payer: Self-pay | Admitting: Ophthalmology

## 2018-01-27 DIAGNOSIS — H04123 Dry eye syndrome of bilateral lacrimal glands: Secondary | ICD-10-CM | POA: Diagnosis not present

## 2018-03-31 ENCOUNTER — Other Ambulatory Visit: Payer: Self-pay | Admitting: Orthopedic Surgery

## 2018-04-24 DIAGNOSIS — G4709 Other insomnia: Secondary | ICD-10-CM | POA: Diagnosis not present

## 2018-04-24 DIAGNOSIS — Z1389 Encounter for screening for other disorder: Secondary | ICD-10-CM | POA: Diagnosis not present

## 2018-04-24 DIAGNOSIS — E663 Overweight: Secondary | ICD-10-CM | POA: Diagnosis not present

## 2018-04-24 DIAGNOSIS — Z6828 Body mass index (BMI) 28.0-28.9, adult: Secondary | ICD-10-CM | POA: Diagnosis not present

## 2018-07-06 ENCOUNTER — Other Ambulatory Visit: Payer: Self-pay | Admitting: Orthopedic Surgery

## 2018-07-19 DIAGNOSIS — J019 Acute sinusitis, unspecified: Secondary | ICD-10-CM | POA: Diagnosis not present

## 2018-07-19 DIAGNOSIS — C50919 Malignant neoplasm of unspecified site of unspecified female breast: Secondary | ICD-10-CM | POA: Diagnosis not present

## 2018-07-19 DIAGNOSIS — E663 Overweight: Secondary | ICD-10-CM | POA: Diagnosis not present

## 2018-07-19 DIAGNOSIS — Z6829 Body mass index (BMI) 29.0-29.9, adult: Secondary | ICD-10-CM | POA: Diagnosis not present

## 2018-07-19 DIAGNOSIS — Z87891 Personal history of nicotine dependence: Secondary | ICD-10-CM | POA: Diagnosis not present

## 2018-07-19 DIAGNOSIS — M545 Low back pain: Secondary | ICD-10-CM | POA: Diagnosis not present

## 2018-07-19 DIAGNOSIS — Z794 Long term (current) use of insulin: Secondary | ICD-10-CM | POA: Diagnosis not present

## 2018-07-19 DIAGNOSIS — Z79899 Other long term (current) drug therapy: Secondary | ICD-10-CM | POA: Diagnosis not present

## 2018-07-19 DIAGNOSIS — D849 Immunodeficiency, unspecified: Secondary | ICD-10-CM | POA: Diagnosis not present

## 2018-07-19 DIAGNOSIS — E1159 Type 2 diabetes mellitus with other circulatory complications: Secondary | ICD-10-CM | POA: Diagnosis not present

## 2018-07-19 DIAGNOSIS — F419 Anxiety disorder, unspecified: Secondary | ICD-10-CM | POA: Diagnosis not present

## 2018-07-19 DIAGNOSIS — Z6836 Body mass index (BMI) 36.0-36.9, adult: Secondary | ICD-10-CM | POA: Diagnosis not present

## 2018-07-19 DIAGNOSIS — Z1389 Encounter for screening for other disorder: Secondary | ICD-10-CM | POA: Diagnosis not present

## 2018-07-19 DIAGNOSIS — N1832 Chronic kidney disease, stage 3b: Secondary | ICD-10-CM | POA: Diagnosis not present

## 2018-07-19 DIAGNOSIS — K621 Rectal polyp: Secondary | ICD-10-CM | POA: Diagnosis not present

## 2018-07-19 DIAGNOSIS — S4991XA Unspecified injury of right shoulder and upper arm, initial encounter: Secondary | ICD-10-CM | POA: Diagnosis not present

## 2018-07-19 DIAGNOSIS — W010XXA Fall on same level from slipping, tripping and stumbling without subsequent striking against object, initial encounter: Secondary | ICD-10-CM | POA: Diagnosis not present

## 2018-07-19 DIAGNOSIS — Z0001 Encounter for general adult medical examination with abnormal findings: Secondary | ICD-10-CM | POA: Diagnosis not present

## 2018-07-19 DIAGNOSIS — Z23 Encounter for immunization: Secondary | ICD-10-CM | POA: Diagnosis not present

## 2018-07-19 DIAGNOSIS — J3081 Allergic rhinitis due to animal (cat) (dog) hair and dander: Secondary | ICD-10-CM | POA: Diagnosis not present

## 2018-07-19 DIAGNOSIS — E7849 Other hyperlipidemia: Secondary | ICD-10-CM | POA: Diagnosis not present

## 2018-07-19 DIAGNOSIS — J3089 Other allergic rhinitis: Secondary | ICD-10-CM | POA: Diagnosis not present

## 2018-07-19 DIAGNOSIS — Z940 Kidney transplant status: Secondary | ICD-10-CM | POA: Diagnosis not present

## 2018-07-19 DIAGNOSIS — I480 Paroxysmal atrial fibrillation: Secondary | ICD-10-CM | POA: Diagnosis not present

## 2018-07-19 DIAGNOSIS — G894 Chronic pain syndrome: Secondary | ICD-10-CM | POA: Diagnosis not present

## 2018-07-19 DIAGNOSIS — J301 Allergic rhinitis due to pollen: Secondary | ICD-10-CM | POA: Diagnosis not present

## 2018-07-19 DIAGNOSIS — E669 Obesity, unspecified: Secondary | ICD-10-CM | POA: Diagnosis not present

## 2018-07-19 DIAGNOSIS — M810 Age-related osteoporosis without current pathological fracture: Secondary | ICD-10-CM | POA: Diagnosis not present

## 2018-07-19 DIAGNOSIS — Z1231 Encounter for screening mammogram for malignant neoplasm of breast: Secondary | ICD-10-CM | POA: Diagnosis not present

## 2018-10-08 ENCOUNTER — Other Ambulatory Visit: Payer: Self-pay | Admitting: Orthopedic Surgery

## 2018-10-16 ENCOUNTER — Other Ambulatory Visit: Payer: Self-pay

## 2018-10-16 MED ORDER — ALLOPURINOL 300 MG PO TABS: 300.0000 mg | ORAL_TABLET | Freq: Every day | ORAL | 1 refills | Status: DC

## 2018-10-16 MED ORDER — ALLOPURINOL 300 MG PO TABS: 300.0000 mg | ORAL_TABLET | Freq: Every day | ORAL | 3 refills | Status: DC

## 2018-10-16 NOTE — Addendum Note (Signed)
Addended by: Willette Pa on: 10/16/2018 06:56 PM   Modules accepted: Orders

## 2018-10-16 NOTE — Telephone Encounter (Signed)
Allopurinol 300 MG  Qty 90 Tablets  Take 1 tablet by mouth once daily.  PATIENT USES [facility]

## 2018-12-27 DIAGNOSIS — H905 Unspecified sensorineural hearing loss: Secondary | ICD-10-CM | POA: Diagnosis not present

## 2018-12-29 DIAGNOSIS — F419 Anxiety disorder, unspecified: Secondary | ICD-10-CM | POA: Diagnosis not present

## 2018-12-29 DIAGNOSIS — Z6829 Body mass index (BMI) 29.0-29.9, adult: Secondary | ICD-10-CM | POA: Diagnosis not present

## 2018-12-29 DIAGNOSIS — E663 Overweight: Secondary | ICD-10-CM | POA: Diagnosis not present

## 2019-02-08 ENCOUNTER — Encounter: Payer: Self-pay | Admitting: *Deleted

## 2019-02-09 ENCOUNTER — Ambulatory Visit (INDEPENDENT_AMBULATORY_CARE_PROVIDER_SITE_OTHER): Payer: PPO | Admitting: Cardiovascular Disease

## 2019-02-09 ENCOUNTER — Institutional Professional Consult (permissible substitution): Payer: PPO | Admitting: Cardiovascular Disease

## 2019-02-09 ENCOUNTER — Other Ambulatory Visit: Payer: Self-pay

## 2019-02-09 ENCOUNTER — Encounter: Payer: Self-pay | Admitting: Cardiovascular Disease

## 2019-02-09 VITALS — BP 102/68 | HR 75 | Ht 67.0 in | Wt 191.0 lb

## 2019-02-09 DIAGNOSIS — Z8249 Family history of ischemic heart disease and other diseases of the circulatory system: Secondary | ICD-10-CM

## 2019-02-09 DIAGNOSIS — R002 Palpitations: Secondary | ICD-10-CM

## 2019-02-09 DIAGNOSIS — E782 Mixed hyperlipidemia: Secondary | ICD-10-CM

## 2019-02-09 DIAGNOSIS — I2510 Atherosclerotic heart disease of native coronary artery without angina pectoris: Secondary | ICD-10-CM | POA: Diagnosis not present

## 2019-02-09 DIAGNOSIS — I2583 Coronary atherosclerosis due to lipid rich plaque: Secondary | ICD-10-CM | POA: Diagnosis not present

## 2019-02-09 NOTE — Progress Notes (Signed)
SUBJECTIVE: The patient presents for follow-up of nonobstructive coronary artery disease. Healsohas a history of hyperlipidemia, prostate cancer s/p radical prostatectomy, and hepatitis C.   Coronary angiography in June 2003 demonstrated mid LAD 30% stenosis and distal LAD 30% stenosis, mild diffuse 30% stenosis in the circumflex, 30-40% PDA stenosis and 20% stenosis in the RCA. Left ventricular systolic function was normal.  He saw Dr. Debara Pickett in January 2014 for chest pain which was deemed noncardiac in etiology. He reportedly underwent a nuclear stress test which was normal on 04/04/2012, with a calculated LVEF of 72%.  ECG performed in the office today which I ordered and personally interpreted demonstrates normal sinus rhythm with no ischemic ST segment or T-wave abnormalities, nor any arrhythmias.  He exercises regularly and has no exercise limiting symptoms whatsoever.  The patient denies any symptoms of chest pain, palpitations, shortness of breath, lightheadedness, dizziness, leg swelling, orthopnea, PND, and syncope.    Review of Systems: As per "subjective", otherwise negative.  No Known Allergies  Current Outpatient Medications  Medication Sig Dispense Refill  . allopurinol (ZYLOPRIM) 300 MG tablet Take 1 tablet (300 mg total) by mouth daily. 90 tablet 3  . ALPRAZolam (XANAX) 1 MG tablet Take 1 mg by mouth at bedtime as needed for anxiety.    . Coenzyme Q10 (COQ10) 100 MG CAPS Take 1 capsule by mouth daily.    . Melatonin 10 MG TABS Take 10 mg by mouth at bedtime.    . metoprolol succinate (TOPROL XL) 25 MG 24 hr tablet Take 1 tablet (25 mg total) by mouth daily. 10 tablet 0  . Multiple Vitamin (MULTIVITAMIN) tablet Take 1 tablet by mouth daily.    . Omega-3 Fatty Acids (FISH OIL) 1200 MG CAPS Take 1 capsule by mouth 2 (two) times daily.    . Potassium 99 MG TABS Take by mouth.    . pravastatin (PRAVACHOL) 20 MG tablet Take 1 tablet (20 mg total) by mouth daily.  (Patient taking differently: Take 20 mg by mouth every evening. ) 15 tablet 0  . rOPINIRole (REQUIP) 0.5 MG tablet Take 0.5 mg by mouth at bedtime.    . sildenafil (REVATIO) 20 MG tablet Take 20 mg by mouth.    . triamterene-hydrochlorothiazide (MAXZIDE-25) 37.5-25 MG tablet Take 1 tablet by mouth daily.    . vitamin C (ASCORBIC ACID) 500 MG tablet Take 500 mg by mouth daily.     No current facility-administered medications for this visit.     Past Medical History:  Diagnosis Date  . CAD (coronary artery disease)    nuclear stress test 04/04/12 EF 72% with no evidence of ischemia, normal wall motion  . Dyslipidemia   . Erectile dysfunction   . FH: prostate cancer 2006   s/p radical protatectomy  . Hepatitis C   . Hypertension   . Meniere disease    w/bilateral hearing aids  . Pneumonia     Past Surgical History:  Procedure Laterality Date  . CARDIAC CATHETERIZATION  2003   mild to moderate by cath  . CATARACT EXTRACTION W/PHACO Right 01/06/2018   Procedure: CATARACT EXTRACTION PHACO AND INTRAOCULAR LENS PLACEMENT (IOC);  Surgeon: Baruch Goldmann, MD;  Location: [facility];  Service: Ophthalmology;  Laterality: Right;  CDE: 5.01  . CATARACT EXTRACTION W/PHACO Left 01/20/2018   Procedure: CATARACT EXTRACTION PHACO AND INTRAOCULAR LENS PLACEMENT LEFT EYE;  Surgeon: Baruch Goldmann, MD;  Location: [facility];  Service: Ophthalmology;  Laterality: Left;  CDE: 3.26  .  INGUINAL HERNIA REPAIR     age 89  . OTHER SURGICAL HISTORY     tracheotomy as a child  . PROSTATECTOMY      Social History   Socioeconomic History  . Marital status: Single    Spouse name: Not on file  . Number of children: Not on file  . Years of education: Not on file  . Highest education level: Not on file  Occupational History  . Not on file  Social Needs  . Financial resource strain: Not on file  . Food insecurity    Worry: Not on file    Inability: Not on file  . Transportation needs    Medical: Not on file     Non-medical: Not on file  Tobacco Use  . Smoking status: Former Smoker    Packs/day: 1.00    Years: 28.00    Pack years: 28.00    Types: Cigarettes    Start date: 07/27/1970    Quit date: 04/19/1998    Years since quitting: 20.8  . Smokeless tobacco: Never Used  Substance and Sexual Activity  . Alcohol use: Yes    Alcohol/week: 0.0 standard drinks  . Drug use: Yes    Types: Marijuana  . Sexual activity: Not on file  Lifestyle  . Physical activity    Days per week: Not on file    Minutes per session: Not on file  . Stress: Not on file  Relationships  . Social Herbalist on phone: Not on file    Gets together: Not on file    Attends religious service: Not on file    Active member of club or organization: Not on file    Attends meetings of clubs or organizations: Not on file    Relationship status: Not on file  . Intimate partner violence    Fear of current or ex partner: Not on file    Emotionally abused: Not on file    Physically abused: Not on file    Forced sexual activity: Not on file  Other Topics Concern  . Not on file  Social History Narrative  . Not on file     Vitals:   02/09/19 1106  BP: 102/68  Pulse: 75  SpO2: 98%  Weight: 191 lb (86.6 kg)  Height: 5\' 7"  (1.702 m)    Wt Readings from Last 3 Encounters:  02/09/19 191 lb (86.6 kg)  01/20/18 183 lb 3.2 oz (83.1 kg)  01/02/18 178 lb (80.7 kg)     PHYSICAL EXAM General: NAD HEENT: Normal. Neck: No JVD, no thyromegaly. Lungs: Clear to auscultation bilaterally with normal respiratory effort. CV: Regular rate and rhythm, normal S1/S2, no S3/S4, no murmur. No pretibial or periankle edema.  No carotid bruit.   Abdomen: Soft, nontender, no distention.  Neurologic: Alert and oriented.  Psych: Normal affect. Skin: Normal. Musculoskeletal: No gross deformities.      Labs: Lab Results  Component Value Date/Time   K 3.3 (L) 01/02/2018 10:40 AM   BUN 9 01/02/2018 10:40 AM   CREATININE  0.89 01/02/2018 10:40 AM   ALT 24 09/16/2011 02:33 PM   HGB 15.8 01/02/2018 10:40 AM     Lipids: No results found for: LDLCALC, LDLDIRECT, CHOL, TRIG, HDL     ASSESSMENT AND PLAN: 1. [id number] stable. Continue metoprolol and pravastatin.  He decided to stop aspirin on his own. Mild to moderate nonobstructive disease in 2003 with normal stress test in 03/2012. Strong family h/o heart  disease and sudden cardiac death. Continue risk factor modification with diet and exercise.  2. Hyperlipidemia:Continue pravastatin.  3.  Palpitations: Symptomatically stable.  Continue Toprol-XL at present dose.    Disposition: Follow up as needed   Kate Sable, M.D., F.A.C.C.

## 2019-02-09 NOTE — Patient Instructions (Signed)
Medication Instructions:  Continue all current medications.  Labwork: none  Testing/Procedures: none  Follow-Up: As needed.    Any Other Special Instructions Will Be Listed Below (If Applicable).  If you need a refill on your cardiac medications before your next appointment, please call your pharmacy.  

## 2019-03-30 DIAGNOSIS — I2510 Atherosclerotic heart disease of native coronary artery without angina pectoris: Secondary | ICD-10-CM | POA: Diagnosis not present

## 2019-03-30 DIAGNOSIS — Z0001 Encounter for general adult medical examination with abnormal findings: Secondary | ICD-10-CM | POA: Diagnosis not present

## 2019-03-30 DIAGNOSIS — Z6829 Body mass index (BMI) 29.0-29.9, adult: Secondary | ICD-10-CM | POA: Diagnosis not present

## 2019-03-30 DIAGNOSIS — Z20828 Contact with and (suspected) exposure to other viral communicable diseases: Secondary | ICD-10-CM | POA: Diagnosis not present

## 2019-03-30 DIAGNOSIS — E663 Overweight: Secondary | ICD-10-CM | POA: Diagnosis not present

## 2019-03-30 DIAGNOSIS — B1920 Unspecified viral hepatitis C without hepatic coma: Secondary | ICD-10-CM | POA: Diagnosis not present

## 2019-03-30 DIAGNOSIS — F419 Anxiety disorder, unspecified: Secondary | ICD-10-CM | POA: Diagnosis not present

## 2019-03-30 DIAGNOSIS — Z1389 Encounter for screening for other disorder: Secondary | ICD-10-CM | POA: Diagnosis not present

## 2019-04-02 DIAGNOSIS — Z1211 Encounter for screening for malignant neoplasm of colon: Secondary | ICD-10-CM | POA: Diagnosis not present

## 2019-07-17 DIAGNOSIS — Z6828 Body mass index (BMI) 28.0-28.9, adult: Secondary | ICD-10-CM | POA: Diagnosis not present

## 2019-07-17 DIAGNOSIS — E663 Overweight: Secondary | ICD-10-CM | POA: Diagnosis not present

## 2019-07-17 DIAGNOSIS — Z0001 Encounter for general adult medical examination with abnormal findings: Secondary | ICD-10-CM | POA: Diagnosis not present

## 2019-07-17 DIAGNOSIS — G4709 Other insomnia: Secondary | ICD-10-CM | POA: Diagnosis not present

## 2019-07-17 DIAGNOSIS — Z1389 Encounter for screening for other disorder: Secondary | ICD-10-CM | POA: Diagnosis not present

## 2019-08-14 ENCOUNTER — Telehealth: Payer: Self-pay | Admitting: Orthopaedic Surgery

## 2019-08-14 NOTE — Telephone Encounter (Signed)
Call received from [facility] at [facility], patient's primary care provider's office - relays that patient has had a pharmacy change to [facility] in [place] - said may need to have a new prescription for Allipurinol.

## 2019-08-17 DIAGNOSIS — I2510 Atherosclerotic heart disease of native coronary artery without angina pectoris: Secondary | ICD-10-CM | POA: Diagnosis not present

## 2019-08-17 DIAGNOSIS — E7849 Other hyperlipidemia: Secondary | ICD-10-CM | POA: Diagnosis not present

## 2019-08-17 DIAGNOSIS — Z8546 Personal history of malignant neoplasm of prostate: Secondary | ICD-10-CM | POA: Diagnosis not present

## 2019-08-17 DIAGNOSIS — Z87891 Personal history of nicotine dependence: Secondary | ICD-10-CM | POA: Diagnosis not present

## 2019-08-17 DIAGNOSIS — M109 Gout, unspecified: Secondary | ICD-10-CM | POA: Diagnosis not present

## 2019-10-11 ENCOUNTER — Telehealth: Payer: Self-pay | Admitting: Orthopaedic Surgery

## 2019-10-11 NOTE — Telephone Encounter (Signed)
I have changed his pharmacy to [facility].  Patient hasn't been seen since 04/07/16 so he may need to be seen. Send request to Dr. Luna Glasgow he may do a one time refill until patient is seen again.

## 2019-10-11 NOTE — Telephone Encounter (Signed)
Call received from [place], [place], [facility], [phone], requesting refill on this patient for medication, for As soon as possible; I relayed Dr is out of clinic until 10/16/19*  * allopurinol (ZYLOPRIM) 300 MG tablet 90 tablet 3 10/16/2018   *Please note: PHARMACY has CHANGED to: [facility], [place]

## 2019-10-15 MED ORDER — ALLOPURINOL 300 MG PO TABS: 300.0000 mg | ORAL_TABLET | Freq: Every day | ORAL | 0 refills | Status: DC

## 2019-10-17 DIAGNOSIS — I2510 Atherosclerotic heart disease of native coronary artery without angina pectoris: Secondary | ICD-10-CM | POA: Diagnosis not present

## 2019-10-17 DIAGNOSIS — M109 Gout, unspecified: Secondary | ICD-10-CM | POA: Diagnosis not present

## 2019-10-17 DIAGNOSIS — Z87891 Personal history of nicotine dependence: Secondary | ICD-10-CM | POA: Diagnosis not present

## 2019-10-17 DIAGNOSIS — I10 Essential (primary) hypertension: Secondary | ICD-10-CM | POA: Diagnosis not present

## 2019-11-16 DIAGNOSIS — I10 Essential (primary) hypertension: Secondary | ICD-10-CM | POA: Diagnosis not present

## 2019-11-16 DIAGNOSIS — I2510 Atherosclerotic heart disease of native coronary artery without angina pectoris: Secondary | ICD-10-CM | POA: Diagnosis not present

## 2019-11-16 DIAGNOSIS — M109 Gout, unspecified: Secondary | ICD-10-CM | POA: Diagnosis not present

## 2019-11-16 DIAGNOSIS — Z87891 Personal history of nicotine dependence: Secondary | ICD-10-CM | POA: Diagnosis not present

## 2019-12-17 DIAGNOSIS — J22 Unspecified acute lower respiratory infection: Secondary | ICD-10-CM | POA: Diagnosis not present

## 2019-12-17 DIAGNOSIS — Z681 Body mass index (BMI) 19 or less, adult: Secondary | ICD-10-CM | POA: Diagnosis not present

## 2019-12-18 DIAGNOSIS — M109 Gout, unspecified: Secondary | ICD-10-CM | POA: Diagnosis not present

## 2019-12-18 DIAGNOSIS — Z87891 Personal history of nicotine dependence: Secondary | ICD-10-CM | POA: Diagnosis not present

## 2019-12-18 DIAGNOSIS — I2510 Atherosclerotic heart disease of native coronary artery without angina pectoris: Secondary | ICD-10-CM | POA: Diagnosis not present

## 2019-12-18 DIAGNOSIS — I10 Essential (primary) hypertension: Secondary | ICD-10-CM | POA: Diagnosis not present

## 2020-01-14 DIAGNOSIS — Z6828 Body mass index (BMI) 28.0-28.9, adult: Secondary | ICD-10-CM | POA: Diagnosis not present

## 2020-01-14 DIAGNOSIS — F419 Anxiety disorder, unspecified: Secondary | ICD-10-CM | POA: Diagnosis not present

## 2020-01-14 DIAGNOSIS — E663 Overweight: Secondary | ICD-10-CM | POA: Diagnosis not present

## 2020-01-17 DIAGNOSIS — E7849 Other hyperlipidemia: Secondary | ICD-10-CM | POA: Diagnosis not present

## 2020-01-17 DIAGNOSIS — I4891 Unspecified atrial fibrillation: Secondary | ICD-10-CM | POA: Diagnosis not present

## 2020-02-04 ENCOUNTER — Other Ambulatory Visit: Payer: Self-pay | Admitting: Orthopaedic Surgery

## 2020-02-16 DIAGNOSIS — Z6828 Body mass index (BMI) 28.0-28.9, adult: Secondary | ICD-10-CM | POA: Diagnosis not present

## 2020-02-16 DIAGNOSIS — E663 Overweight: Secondary | ICD-10-CM | POA: Diagnosis not present

## 2020-02-16 DIAGNOSIS — I2510 Atherosclerotic heart disease of native coronary artery without angina pectoris: Secondary | ICD-10-CM | POA: Diagnosis not present

## 2020-03-18 DIAGNOSIS — Z6828 Body mass index (BMI) 28.0-28.9, adult: Secondary | ICD-10-CM | POA: Diagnosis not present

## 2020-03-18 DIAGNOSIS — I2510 Atherosclerotic heart disease of native coronary artery without angina pectoris: Secondary | ICD-10-CM | POA: Diagnosis not present

## 2020-03-18 DIAGNOSIS — E663 Overweight: Secondary | ICD-10-CM | POA: Diagnosis not present

## 2020-04-09 DIAGNOSIS — I2510 Atherosclerotic heart disease of native coronary artery without angina pectoris: Secondary | ICD-10-CM | POA: Diagnosis not present

## 2020-04-09 DIAGNOSIS — E7849 Other hyperlipidemia: Secondary | ICD-10-CM | POA: Diagnosis not present

## 2020-04-09 DIAGNOSIS — H9193 Unspecified hearing loss, bilateral: Secondary | ICD-10-CM | POA: Diagnosis not present

## 2020-04-09 DIAGNOSIS — R7309 Other abnormal glucose: Secondary | ICD-10-CM | POA: Diagnosis not present

## 2020-04-09 DIAGNOSIS — Z8546 Personal history of malignant neoplasm of prostate: Secondary | ICD-10-CM | POA: Diagnosis not present

## 2020-04-09 DIAGNOSIS — F419 Anxiety disorder, unspecified: Secondary | ICD-10-CM | POA: Diagnosis not present

## 2020-04-09 DIAGNOSIS — Z6828 Body mass index (BMI) 28.0-28.9, adult: Secondary | ICD-10-CM | POA: Diagnosis not present

## 2020-04-18 DIAGNOSIS — Z6828 Body mass index (BMI) 28.0-28.9, adult: Secondary | ICD-10-CM | POA: Diagnosis not present

## 2020-04-18 DIAGNOSIS — E663 Overweight: Secondary | ICD-10-CM | POA: Diagnosis not present

## 2020-04-18 DIAGNOSIS — I2510 Atherosclerotic heart disease of native coronary artery without angina pectoris: Secondary | ICD-10-CM | POA: Diagnosis not present

## 2020-05-17 DIAGNOSIS — Z6828 Body mass index (BMI) 28.0-28.9, adult: Secondary | ICD-10-CM | POA: Diagnosis not present

## 2020-05-17 DIAGNOSIS — E663 Overweight: Secondary | ICD-10-CM | POA: Diagnosis not present

## 2020-05-17 DIAGNOSIS — I2510 Atherosclerotic heart disease of native coronary artery without angina pectoris: Secondary | ICD-10-CM | POA: Diagnosis not present

## 2020-05-21 DIAGNOSIS — Z6829 Body mass index (BMI) 29.0-29.9, adult: Secondary | ICD-10-CM | POA: Diagnosis not present

## 2020-05-21 DIAGNOSIS — R1030 Lower abdominal pain, unspecified: Secondary | ICD-10-CM | POA: Diagnosis not present

## 2020-05-21 DIAGNOSIS — Z1331 Encounter for screening for depression: Secondary | ICD-10-CM | POA: Diagnosis not present

## 2020-05-21 DIAGNOSIS — E663 Overweight: Secondary | ICD-10-CM | POA: Diagnosis not present

## 2020-06-10 ENCOUNTER — Encounter: Payer: Self-pay | Admitting: Internal Medicine

## 2020-06-12 DIAGNOSIS — R109 Unspecified abdominal pain: Secondary | ICD-10-CM | POA: Diagnosis not present

## 2020-06-12 DIAGNOSIS — R1030 Lower abdominal pain, unspecified: Secondary | ICD-10-CM | POA: Diagnosis not present

## 2020-06-16 DIAGNOSIS — E663 Overweight: Secondary | ICD-10-CM | POA: Diagnosis not present

## 2020-06-16 DIAGNOSIS — Z6828 Body mass index (BMI) 28.0-28.9, adult: Secondary | ICD-10-CM | POA: Diagnosis not present

## 2020-06-16 DIAGNOSIS — I2510 Atherosclerotic heart disease of native coronary artery without angina pectoris: Secondary | ICD-10-CM | POA: Diagnosis not present

## 2020-07-08 DIAGNOSIS — Z6829 Body mass index (BMI) 29.0-29.9, adult: Secondary | ICD-10-CM | POA: Diagnosis not present

## 2020-07-08 DIAGNOSIS — F419 Anxiety disorder, unspecified: Secondary | ICD-10-CM | POA: Diagnosis not present

## 2020-07-08 DIAGNOSIS — G4709 Other insomnia: Secondary | ICD-10-CM | POA: Diagnosis not present

## 2020-07-08 DIAGNOSIS — E663 Overweight: Secondary | ICD-10-CM | POA: Diagnosis not present

## 2020-07-14 NOTE — Progress Notes (Signed)
Referring Provider: Sharilyn Sites, MD Primary Care Physician:  Sharilyn Sites, MD Primary Gastroenterologist:  Dr. Abbey Chatters  Chief Complaint  Patient presents with  . Colonoscopy    Denies any problems/concerns. No abd pain since cutting out coffee/beans    HPI:   Theodore Hart is a 65 y.o. male presenting today at the request of  Sharilyn Sites, MD for consult colonoscopy and abdominal pain.   CT A/P with contrast 06/13/2020 for lower abdominal pain with cramping with no acute findings.  Today he states he is doing very well without any abdominal pain.  He quit drinking coffee and limited pork and beans, and had resolution of lower abdominal pain. Thinks it was "bad gas". No trouble with constipation or diarrhea, bright red blood per rectum, or black stools. No unintentional weight loss. Exercises routinely.   History of acid reflux.  Takes omeprazole every other night which works very well. No dysphagia or vomiting.   Colonoscopy about 15 years ago at [facility]. No polyps.   History of Hep C. Treated about 15 years ago and was told he was cured by 2 different doctors. Had to take interferon and "a pill" every morning x6 months.  After patient left the office, I was able to locate his prior colonoscopy in the records which was completed in October 2007 with Dr. Gala Romney.  Exam was entirely normal with recommendations to repeat in 10 years.  Past Medical History:  Diagnosis Date  . CAD (coronary artery disease)    nuclear stress test 04/04/12 EF 72% with no evidence of ischemia, normal wall motion  . Dyslipidemia   . Erectile dysfunction   . Hepatitis C    "treated 15 years ago" (documented March 2022)  . Meniere disease    w/bilateral hearing aids  . Pneumonia   . Prostate cancer [facility]) 2006   s/p radical protatectomy  . Restless leg syndrome     Past Surgical History:  Procedure Laterality Date  . CARDIAC CATHETERIZATION  2003   mild to moderate by cath  . CATARACT  EXTRACTION W/PHACO Right 01/06/2018   Procedure: CATARACT EXTRACTION PHACO AND INTRAOCULAR LENS PLACEMENT (IOC);  Surgeon: Baruch Goldmann, MD;  Location: [facility];  Service: Ophthalmology;  Laterality: Right;  CDE: 5.01  . CATARACT EXTRACTION W/PHACO Left 01/20/2018   Procedure: CATARACT EXTRACTION PHACO AND INTRAOCULAR LENS PLACEMENT LEFT EYE;  Surgeon: Baruch Goldmann, MD;  Location: [facility];  Service: Ophthalmology;  Laterality: Left;  CDE: 3.26  . INGUINAL HERNIA REPAIR     age 85  . OTHER SURGICAL HISTORY     tracheotomy as a child  . PROSTATECTOMY      Current Outpatient Medications  Medication Sig Dispense Refill  . allopurinol (ZYLOPRIM) 300 MG tablet TAKE ONE TABLET BY MOUTH ONCE DAILY 90 tablet 5  . ALPRAZolam (XANAX) 1 MG tablet Take 1 mg by mouth at bedtime as needed for anxiety.    . Cholecalciferol (VITAMIN D3) 50 MCG (2000 UT) CHEW Chew by mouth.    . Coenzyme Q10 (COQ10) 100 MG CAPS Take 1 capsule by mouth daily.    . metoprolol succinate (TOPROL XL) 25 MG 24 hr tablet Take 1 tablet (25 mg total) by mouth daily. 10 tablet 0  . Multiple Vitamin (MULTIVITAMIN) tablet Take 1 tablet by mouth daily.    . Omega-3 Fatty Acids (FISH OIL) 1200 MG CAPS Take 1 capsule by mouth 2 (two) times daily.    Marland Kitchen omeprazole (PRILOSEC) 20 MG capsule Take  20 mg by mouth daily. Every other night    . Potassium 99 MG TABS Take by mouth.    . pravastatin (PRAVACHOL) 20 MG tablet Take 1 tablet (20 mg total) by mouth daily. (Patient taking differently: Take 20 mg by mouth every evening.) 15 tablet 0  . rOPINIRole (REQUIP) 0.5 MG tablet Take 0.5 mg by mouth at bedtime.    . sildenafil (REVATIO) 20 MG tablet Take 20 mg by mouth. As needed    . triamterene-hydrochlorothiazide (MAXZIDE-25) 37.5-25 MG tablet Take 1 tablet by mouth daily.    . vitamin C (ASCORBIC ACID) 500 MG tablet Take 500 mg by mouth daily.    . polyethylene glycol-electrolytes (TRILYTE) 420 g solution Take 4,000 mLs by mouth as directed. 4000  mL 0   No current facility-administered medications for this visit.    Allergies as of 07/16/2020  . (No Known Allergies)    Family History  Problem Relation Age of Onset  . Alzheimer's disease Mother   . Hypertension Father   . Heart attack Father   . Hypertension Sister   . Hypertension Brother   . Cancer Brother   . Heart attack Paternal Grandfather   . Stroke Maternal Grandfather   . Heart disease Other   . Colon cancer Neg Hx     Social History   Socioeconomic History  . Marital status: Single    Spouse name: Not on file  . Number of children: Not on file  . Years of education: Not on file  . Highest education level: Not on file  Occupational History  . Not on file  Tobacco Use  . Smoking status: Former Smoker    Packs/day: 1.00    Years: 28.00    Pack years: 28.00    Types: Cigarettes    Start date: 07/27/1970    Quit date: 04/19/1998    Years since quitting: 22.2  . Smokeless tobacco: Never Used  Substance and Sexual Activity  . Alcohol use: Yes    Alcohol/week: 0.0 standard drinks    Comment: About 4-6 beer a week.   . Drug use: Yes    Types: Marijuana    Comment: daily  . Sexual activity: Not on file  Other Topics Concern  . Not on file  Social History Narrative  . Not on file   Social Determinants of Health   Financial Resource Strain: Not on file  Food Insecurity: Not on file  Transportation Needs: Not on file  Physical Activity: Not on file  Stress: Not on file  Social Connections: Not on file  Intimate Partner Violence: Not on file    Review of Systems: Gen: Denies any fever, chills, cold or flulike symptoms, lightheadedness, dizziness, presyncope, syncope. CV: Denies chest pain or heart palpitations. Resp: Denies shortness of breath or cough. GI: See HPI GU : Denies urinary burning, urinary frequency, urinary hesitancy MS: Denies joint pain  Derm: Denies rash Psych: Denies depression or anxiety Heme: See HPI  Physical Exam: BP  121/75   Pulse 83   Temp (!) 97.3 F (36.3 C)   Wt 186 lb 3.2 oz (84.5 kg)   BMI 29.16 kg/m  General:   Alert and oriented. Pleasant and cooperative. Well-nourished and well-developed.  Head:  Normocephalic and atraumatic. Eyes:  Without icterus, sclera clear and conjunctiva pink.  Ears:  Decreased auditory acuity. Lungs:  Clear to auscultation bilaterally. No wheezes, rales, or rhonchi. No distress.  Heart:  S1, S2 present without murmurs appreciated.  Abdomen:  +  BS, soft, non-tender and non-distended. No HSM noted. No guarding or rebound. No masses appreciated.  Rectal:  Deferred  Msk:  Symmetrical without gross deformities. Normal posture. Extremities:  Without edema. Neurologic:  Alert and  oriented x4;  grossly normal neurologically. Skin:  Intact without significant lesions or rashes. Psych:  Normal mood and affect.   Assessment: 65 year old male presenting today to schedule screening colonoscopy.  Last colonoscopy in 2007 entirely normal.  He denies any significant upper or lower GI symptoms.  He does have history of GERD that is well controlled with omeprazole.  No alarm symptoms.  No family history of colon cancer.  *I was able to locate patient's last colonoscopy report in the records after he left the office.  His last colonoscopy 2007 was performed by Dr. Gala Romney.  We have not seen patient in the office for any other reason since his colonoscopy.  I have sent a note to the clinical team to advised patient that we can reschedule him with Dr. Gala Romney or just continue to have him following as Dr. Ave Filter patient moving forward.   Plan: 1.  Proceed with colonoscopy with propofol with Dr. Abbey Chatters in the near future. The risks, benefits, and alternatives have been discussed with the patient in detail. The patient states understanding and desires to proceed.  ASA 2 2.  Follow-up per Dr. Abbey Chatters.      Aliene Altes, PA-C [facility] 07/16/2020

## 2020-07-16 ENCOUNTER — Ambulatory Visit: Payer: PPO | Admitting: Gastroenterology

## 2020-07-16 ENCOUNTER — Encounter: Payer: Self-pay | Admitting: Gastroenterology

## 2020-07-16 ENCOUNTER — Other Ambulatory Visit: Payer: Self-pay

## 2020-07-16 VITALS — BP 121/75 | HR 83 | Temp 97.3°F | Wt 186.2 lb

## 2020-07-16 DIAGNOSIS — I2510 Atherosclerotic heart disease of native coronary artery without angina pectoris: Secondary | ICD-10-CM | POA: Diagnosis not present

## 2020-07-16 DIAGNOSIS — Z6828 Body mass index (BMI) 28.0-28.9, adult: Secondary | ICD-10-CM | POA: Diagnosis not present

## 2020-07-16 DIAGNOSIS — Z1211 Encounter for screening for malignant neoplasm of colon: Secondary | ICD-10-CM | POA: Diagnosis not present

## 2020-07-16 DIAGNOSIS — E663 Overweight: Secondary | ICD-10-CM | POA: Diagnosis not present

## 2020-07-16 MED ORDER — PEG 3350-KCL-NA BICARB-NACL 420 G PO SOLR: 4000.0000 mL | ORAL | 0 refills | Status: AC

## 2020-07-16 NOTE — Patient Instructions (Signed)
We will arrange for you to have a colonoscopy in the near future with Dr. Abbey Chatters.  We will plan to see you back as needed.  Do not hesitate to call if you have any new GI concerns.  It was good to meet you today.   Aliene Altes, PA-C [facility]enter[facility]

## 2020-07-17 ENCOUNTER — Encounter: Payer: Self-pay | Admitting: Gastroenterology

## 2020-07-17 ENCOUNTER — Telehealth: Payer: Self-pay | Admitting: Gastroenterology

## 2020-07-17 NOTE — Telephone Encounter (Signed)
Noted  

## 2020-07-17 NOTE — Telephone Encounter (Signed)
After patient left the office, I was able to locate his colonoscopy report from 2007. Colonoscopy was completed with Dr. Gala Romney. He has not been seen for any other reason in our office aside from having his colonoscopy in 2007. We can reschedule him with Dr. Gala Romney or just have him continue to follow with Dr. Abbey Chatters moving forward.

## 2020-07-17 NOTE — Telephone Encounter (Signed)
Patient has already been scheduled with Dr. Abbey Chatters. We will just leave as is.

## 2020-07-21 ENCOUNTER — Telehealth: Payer: Self-pay

## 2020-07-21 NOTE — Telephone Encounter (Signed)
Pt called office and LMOVM requesting to reschedule TCS that's for 08/08/20 d/t his transportation will not be available.  Called pt, TCS rescheduled to 08/18/20--AM. COVID test rescheduled to 08/15/20. Appt letter mailed with new procedure instructions. Endo scheduler informed.

## 2020-08-06 ENCOUNTER — Other Ambulatory Visit (HOSPITAL_COMMUNITY): Payer: PPO

## 2020-08-14 ENCOUNTER — Telehealth: Payer: Self-pay | Admitting: *Deleted

## 2020-08-14 NOTE — Telephone Encounter (Signed)
Patient called back and he is aware of arrival time

## 2020-08-14 NOTE — Telephone Encounter (Signed)
Received call from melanie in endo unable to reach patient regarding arrival time for procedure on Monday. He needs to arrive 7:30am  Called pt, LMOVM to call back

## 2020-08-15 ENCOUNTER — Other Ambulatory Visit: Payer: Self-pay

## 2020-08-15 ENCOUNTER — Other Ambulatory Visit (HOSPITAL_COMMUNITY): Payer: Self-pay | Admitting: *Deleted

## 2020-08-15 ENCOUNTER — Other Ambulatory Visit (HOSPITAL_COMMUNITY)
Admission: RE | Admit: 2020-08-15 | Discharge: 2020-08-15 | Disposition: A | Discharge status: Home / Self Care | Payer: PPO | Source: Ambulatory Visit | Attending: Internal Medicine | Admitting: Internal Medicine

## 2020-08-15 DIAGNOSIS — U071 COVID-19: Secondary | ICD-10-CM | POA: Insufficient documentation

## 2020-08-15 LAB — BASIC METABOLIC PANEL
Anion gap: 7 (ref 5–15)
BUN: 10 mg/dL (ref 8–23)
CO2: 27 mmol/L (ref 22–32)
Calcium: 9.3 mg/dL (ref 8.9–10.3)
Chloride: 100 mmol/L (ref 98–111)
Creatinine, Ser: 0.87 mg/dL (ref 0.61–1.24)
GFR, Estimated: >60 mL/min (ref >60)
Glucose, Bld: 96 mg/dL (ref 70–99)
Potassium: 3.9 mmol/L (ref 3.5–5.1)
Sodium: 134 mmol/L — ABNORMAL LOW (ref 135–145)

## 2020-08-15 NOTE — Progress Notes (Signed)
Patients order was not put into Epic. I created the order, released it, printed the requisition and sent pt. To lab for blood work. Patient has to have a Covid Screening before his procedure.

## 2020-08-16 DIAGNOSIS — Z6828 Body mass index (BMI) 28.0-28.9, adult: Secondary | ICD-10-CM | POA: Diagnosis not present

## 2020-08-16 DIAGNOSIS — E663 Overweight: Secondary | ICD-10-CM | POA: Diagnosis not present

## 2020-08-16 DIAGNOSIS — I2510 Atherosclerotic heart disease of native coronary artery without angina pectoris: Secondary | ICD-10-CM | POA: Diagnosis not present

## 2020-08-16 LAB — SARS CORONAVIRUS 2 (TAT 6-24 HRS): SARS Coronavirus 2: POSITIVE — AB

## 2020-08-16 NOTE — OR Nursing (Signed)
Covid test is positive, patient notified . Aware that office will notify him when he can be rescheduled.

## 2020-08-17 ENCOUNTER — Telehealth: Payer: Self-pay | Admitting: Family

## 2020-08-17 NOTE — Telephone Encounter (Signed)
Called to discuss with patient about COVID-19 symptoms and the use of one of the available treatments for those with mild to moderate Covid symptoms and at a high risk of hospitalization.  Pt appears to qualify for outpatient treatment due to co-morbid conditions and/or a member of an at-risk group in accordance with the FDA Emergency Use Authorization.    Symptom onset: Unknown Vaccinated:  Booster?  Immunocompromised?  Qualifiers: Age, hypertension, BMI 29 NIH Criteria: Tier 1  Attempted to contact Theodore Hart and was unable to reach him via telephone. A voicemail with call back number was left. No My Chart was available.   [name], NP 08/17/2020 10:00 AM

## 2020-08-18 ENCOUNTER — Telehealth: Payer: Self-pay

## 2020-08-18 NOTE — Telephone Encounter (Signed)
Received voicemail from [facility] at [facility], TCS for today was cancelled d/t positive covid test. Pt aware. Pt had said he done home test approx 2 weeks ago and it was positive. TCS needs to be rescheduled.  Tried to call pt, LMOVM for return call.

## 2020-08-26 DIAGNOSIS — Z8546 Personal history of malignant neoplasm of prostate: Secondary | ICD-10-CM | POA: Diagnosis not present

## 2020-08-26 DIAGNOSIS — Z0001 Encounter for general adult medical examination with abnormal findings: Secondary | ICD-10-CM | POA: Diagnosis not present

## 2020-08-26 DIAGNOSIS — I2510 Atherosclerotic heart disease of native coronary artery without angina pectoris: Secondary | ICD-10-CM | POA: Diagnosis not present

## 2020-08-26 DIAGNOSIS — E7849 Other hyperlipidemia: Secondary | ICD-10-CM | POA: Diagnosis not present

## 2020-08-26 DIAGNOSIS — E663 Overweight: Secondary | ICD-10-CM | POA: Diagnosis not present

## 2020-08-26 DIAGNOSIS — Z1331 Encounter for screening for depression: Secondary | ICD-10-CM | POA: Diagnosis not present

## 2020-08-26 DIAGNOSIS — Z6829 Body mass index (BMI) 29.0-29.9, adult: Secondary | ICD-10-CM | POA: Diagnosis not present

## 2020-08-26 DIAGNOSIS — M109 Gout, unspecified: Secondary | ICD-10-CM | POA: Diagnosis not present

## 2020-08-26 DIAGNOSIS — B1920 Unspecified viral hepatitis C without hepatic coma: Secondary | ICD-10-CM | POA: Diagnosis not present

## 2020-08-26 DIAGNOSIS — H8103 Meniere's disease, bilateral: Secondary | ICD-10-CM | POA: Diagnosis not present

## 2020-08-26 DIAGNOSIS — F419 Anxiety disorder, unspecified: Secondary | ICD-10-CM | POA: Diagnosis not present

## 2020-08-26 DIAGNOSIS — Z1389 Encounter for screening for other disorder: Secondary | ICD-10-CM | POA: Diagnosis not present

## 2020-08-26 NOTE — Telephone Encounter (Signed)
Late entry: Spoke to pt 08/18/20, TCS rescheduled to 09/22/20 at 12:00pm. He's aware he will not need covid test for 90 days since he was positive. Endo scheduler informed. New instructions maled.

## 2020-09-16 DIAGNOSIS — I2510 Atherosclerotic heart disease of native coronary artery without angina pectoris: Secondary | ICD-10-CM | POA: Diagnosis not present

## 2020-09-16 DIAGNOSIS — E663 Overweight: Secondary | ICD-10-CM | POA: Diagnosis not present

## 2020-09-16 DIAGNOSIS — Z6828 Body mass index (BMI) 28.0-28.9, adult: Secondary | ICD-10-CM | POA: Diagnosis not present

## 2020-09-17 ENCOUNTER — Telehealth: Payer: Self-pay | Admitting: *Deleted

## 2020-09-17 NOTE — Telephone Encounter (Signed)
Received call from endo unable to reach pt. covid test cancelled but still needs lab work done Friday.  Called pt and left detailed message on VM.

## 2020-09-19 DIAGNOSIS — Z1211 Encounter for screening for malignant neoplasm of colon: Secondary | ICD-10-CM | POA: Diagnosis not present

## 2020-09-19 DIAGNOSIS — Z961 Presence of intraocular lens: Secondary | ICD-10-CM | POA: Diagnosis not present

## 2020-09-19 DIAGNOSIS — K635 Polyp of colon: Secondary | ICD-10-CM | POA: Diagnosis not present

## 2020-09-19 DIAGNOSIS — K648 Other hemorrhoids: Secondary | ICD-10-CM | POA: Diagnosis not present

## 2020-09-19 DIAGNOSIS — Z9841 Cataract extraction status, right eye: Secondary | ICD-10-CM | POA: Diagnosis not present

## 2020-09-19 DIAGNOSIS — Z8546 Personal history of malignant neoplasm of prostate: Secondary | ICD-10-CM | POA: Diagnosis not present

## 2020-09-19 DIAGNOSIS — Z9079 Acquired absence of other genital organ(s): Secondary | ICD-10-CM | POA: Diagnosis not present

## 2020-09-19 DIAGNOSIS — Z87891 Personal history of nicotine dependence: Secondary | ICD-10-CM | POA: Diagnosis not present

## 2020-09-19 DIAGNOSIS — Z9842 Cataract extraction status, left eye: Secondary | ICD-10-CM | POA: Diagnosis not present

## 2020-09-19 LAB — BASIC METABOLIC PANEL
Anion gap: 8 (ref 5–15)
BUN: 15 mg/dL (ref 8–23)
CO2: 28 mmol/L (ref 22–32)
Calcium: 9.2 mg/dL (ref 8.9–10.3)
Chloride: 99 mmol/L (ref 98–111)
Creatinine, Ser: 1.01 mg/dL (ref 0.61–1.24)
GFR, Estimated: >60 mL/min (ref >60)
Glucose, Bld: 115 mg/dL — ABNORMAL HIGH (ref 70–99)
Potassium: 3.6 mmol/L (ref 3.5–5.1)
Sodium: 135 mmol/L (ref 135–145)

## 2020-09-22 ENCOUNTER — Other Ambulatory Visit: Payer: Self-pay

## 2020-09-22 ENCOUNTER — Ambulatory Visit (HOSPITAL_COMMUNITY): Payer: PPO | Admitting: Anesthesiology

## 2020-09-22 ENCOUNTER — Ambulatory Visit (HOSPITAL_COMMUNITY)
Admission: RE | Admit: 2020-09-22 | Discharge: 2020-09-22 | Disposition: A | Discharge status: Home / Self Care | Payer: PPO | Source: Ambulatory Visit | Attending: Internal Medicine | Admitting: Internal Medicine

## 2020-09-22 ENCOUNTER — Encounter (HOSPITAL_COMMUNITY): Payer: Self-pay

## 2020-09-22 ENCOUNTER — Encounter (HOSPITAL_COMMUNITY)
Admission: RE | Disposition: A | Discharge status: Home / Self Care | Payer: Self-pay | Source: Ambulatory Visit | Attending: Internal Medicine

## 2020-09-22 DIAGNOSIS — Z961 Presence of intraocular lens: Secondary | ICD-10-CM | POA: Diagnosis not present

## 2020-09-22 DIAGNOSIS — Z8546 Personal history of malignant neoplasm of prostate: Secondary | ICD-10-CM | POA: Diagnosis not present

## 2020-09-22 DIAGNOSIS — Z87891 Personal history of nicotine dependence: Secondary | ICD-10-CM | POA: Diagnosis not present

## 2020-09-22 DIAGNOSIS — K635 Polyp of colon: Secondary | ICD-10-CM | POA: Insufficient documentation

## 2020-09-22 DIAGNOSIS — Z9842 Cataract extraction status, left eye: Secondary | ICD-10-CM | POA: Diagnosis not present

## 2020-09-22 DIAGNOSIS — Z9079 Acquired absence of other genital organ(s): Secondary | ICD-10-CM | POA: Diagnosis not present

## 2020-09-22 DIAGNOSIS — K648 Other hemorrhoids: Secondary | ICD-10-CM | POA: Insufficient documentation

## 2020-09-22 DIAGNOSIS — Z1211 Encounter for screening for malignant neoplasm of colon: Secondary | ICD-10-CM | POA: Diagnosis not present

## 2020-09-22 DIAGNOSIS — I2510 Atherosclerotic heart disease of native coronary artery without angina pectoris: Secondary | ICD-10-CM | POA: Diagnosis not present

## 2020-09-22 DIAGNOSIS — Z9841 Cataract extraction status, right eye: Secondary | ICD-10-CM | POA: Diagnosis not present

## 2020-09-22 HISTORY — PX: POLYPECTOMY: SHX5525

## 2020-09-22 HISTORY — PX: COLONOSCOPY WITH PROPOFOL: SHX5780

## 2020-09-22 SURGERY — COLONOSCOPY WITH PROPOFOL
Anesthesia: General

## 2020-09-22 MED ORDER — PROPOFOL 10 MG/ML IV BOLUS
INTRAVENOUS | Status: DC | PRN
  Administered 2020-09-22: 125 ug/kg/min via INTRAVENOUS
  Administered 2020-09-22: 100 mg via INTRAVENOUS

## 2020-09-22 MED ORDER — LACTATED RINGERS IV SOLN: INTRAVENOUS | Status: DC

## 2020-09-22 MED ORDER — PROPOFOL 10 MG/ML IV BOLUS
INTRAVENOUS | Status: AC
  Filled 2020-09-22: qty 120

## 2020-09-22 NOTE — H&P (Signed)
Primary Care Physician:  Sharilyn Sites, MD Primary Gastroenterologist:  Dr. Abbey Chatters  Pre-Procedure History & Physical: HPI:  EVO ADERMAN is a 65 y.o. male is here for a colonoscopy for colon cancer screening purposes.  Patient denies any family history of colorectal cancer.  No melena or hematochezia.  No abdominal pain or unintentional weight loss.  No change in bowel habits.  Overall feels well from a GI standpoint.  Past Medical History:  Diagnosis Date  . CAD (coronary artery disease)    nuclear stress test 04/04/12 EF 72% with no evidence of ischemia, normal wall motion  . Dyslipidemia   . Erectile dysfunction   . Hepatitis C    "treated 15 years ago" (documented March 2022)  . Meniere disease    w/bilateral hearing aids  . Pneumonia   . Prostate cancer [facility]) 2006   s/p radical protatectomy  . Restless leg syndrome     Past Surgical History:  Procedure Laterality Date  . CARDIAC CATHETERIZATION  2003   mild to moderate by cath  . CATARACT EXTRACTION W/PHACO Right 01/06/2018   Procedure: CATARACT EXTRACTION PHACO AND INTRAOCULAR LENS PLACEMENT (IOC);  Surgeon: Baruch Goldmann, MD;  Location: [facility];  Service: Ophthalmology;  Laterality: Right;  CDE: 5.01  . CATARACT EXTRACTION W/PHACO Left 01/20/2018   Procedure: CATARACT EXTRACTION PHACO AND INTRAOCULAR LENS PLACEMENT LEFT EYE;  Surgeon: Baruch Goldmann, MD;  Location: [facility];  Service: Ophthalmology;  Laterality: Left;  CDE: 3.26  . INGUINAL HERNIA REPAIR     age 14  . OTHER SURGICAL HISTORY     tracheotomy as a child  . PROSTATECTOMY      Prior to Admission medications   Medication Sig Start Date End Date Taking? Authorizing Provider  allopurinol (ZYLOPRIM) 300 MG tablet TAKE ONE TABLET BY MOUTH ONCE DAILY Patient taking differently: Take 300 mg by mouth daily. 02/04/20  Yes Sanjuana Kava, MD  ALPRAZolam (XANAX XR) 1 MG 24 hr tablet Take 1 mg by mouth at bedtime.   Yes [provider]  Cholecalciferol  (VITAMIN D3) 50 MCG (2000 UT) CHEW Chew 2,000 Units by mouth daily.   Yes [provider]  metoprolol succinate (TOPROL XL) 25 MG 24 hr tablet Take 1 tablet (25 mg total) by mouth daily. 08/10/13  Yes Hilty, Nadean Corwin, MD  Multiple Vitamin (MULTIVITAMIN) tablet Take 1 tablet by mouth daily.   Yes [provider]  Omega-3 Fatty Acids (FISH OIL) 1000 MG CAPS Take 1,000 mg by mouth daily.   Yes [provider]  omeprazole (PRILOSEC) 20 MG capsule Take 20 mg by mouth every other day. Every other night   Yes [provider]  polyethylene glycol-electrolytes (TRILYTE) 420 g solution Take 4,000 mLs by mouth as directed. 07/16/20  Yes Eloise Harman, DO  Potassium 99 MG TABS Take 99 mg by mouth daily.   Yes [provider]  pravastatin (PRAVACHOL) 20 MG tablet Take 1 tablet (20 mg total) by mouth daily. Patient taking differently: Take 20 mg by mouth every evening. 07/06/13  Yes Hilty, Nadean Corwin, MD  rOPINIRole (REQUIP) 0.5 MG tablet Take 0.5 mg by mouth at bedtime.   Yes [provider]  triamterene-hydrochlorothiazide (MAXZIDE-25) 37.5-25 MG tablet Take 1 tablet by mouth daily.   Yes [provider]  vitamin B-12 (CYANOCOBALAMIN) 1000 MCG tablet Take 1,000 mcg by mouth daily.   Yes [provider]  vitamin C (ASCORBIC ACID) 500 MG tablet Take 500 mg by mouth daily.  Yes [provider]    Allergies as of 07/16/2020  . (No Known Allergies)    Family History  Problem Relation Age of Onset  . Alzheimer's disease Mother   . Hypertension Father   . Heart attack Father   . Hypertension Sister   . Hypertension Brother   . Cancer Brother   . Heart attack Paternal Grandfather   . Stroke Maternal Grandfather   . Heart disease Other   . Colon cancer Neg Hx     Social History   Socioeconomic History  . Marital status: Single    Spouse name: Not on file  . Number of children: Not on file  . Years of education: Not  on file  . Highest education level: Not on file  Occupational History  . Not on file  Tobacco Use  . Smoking status: Former Smoker    Packs/day: 1.00    Years: 28.00    Pack years: 28.00    Types: Cigarettes    Start date: 07/27/1970    Quit date: 04/19/1998    Years since quitting: 22.4  . Smokeless tobacco: Never Used  Substance and Sexual Activity  . Alcohol use: Yes    Alcohol/week: 0.0 standard drinks    Comment: About 4-6 beer a week.   . Drug use: Yes    Types: Marijuana    Comment: daily  . Sexual activity: Not on file  Other Topics Concern  . Not on file  Social History Narrative  . Not on file   Social Determinants of Health   Financial Resource Strain: Not on file  Food Insecurity: Not on file  Transportation Needs: Not on file  Physical Activity: Not on file  Stress: Not on file  Social Connections: Not on file  Intimate Partner Violence: Not on file    Review of Systems: See HPI, otherwise negative ROS  Physical Exam: Vital signs in last 24 hours:     General:   Alert,  Well-developed, well-nourished, pleasant and cooperative in NAD Head:  Normocephalic and atraumatic. Eyes:  Sclera clear, no icterus.   Conjunctiva pink. Ears:  Normal auditory acuity. Nose:  No deformity, discharge,  or lesions. Mouth:  No deformity or lesions, dentition normal. Neck:  Supple; no masses or thyromegaly. Lungs:  Clear throughout to auscultation.   No wheezes, crackles, or rhonchi. No acute distress. Heart:  Regular rate and rhythm; no murmurs, clicks, rubs,  or gallops. Abdomen:  Soft, nontender and nondistended. No masses, hepatosplenomegaly or hernias noted. Normal bowel sounds, without guarding, and without rebound.   Msk:  Symmetrical without gross deformities. Normal posture. Extremities:  Without clubbing or edema. Neurologic:  Alert and  oriented x4;  grossly normal neurologically. Skin:  Intact without significant lesions or rashes. Cervical Nodes:  No  significant cervical adenopathy. Psych:  Alert and cooperative. Normal mood and affect.  Impression/Plan: Theodore Hart is here for a colonoscopy to be performed for colon cancer screening purposes.  The risks of the procedure including infection, bleed, or perforation as well as benefits, limitations, alternatives and imponderables have been reviewed with the patient. Questions have been answered. All parties agreeable.

## 2020-09-22 NOTE — Transfer of Care (Signed)
Immediate Anesthesia Transfer of Care Note  Patient: Theodore Hart  Procedure(s) Performed: COLONOSCOPY WITH PROPOFOL (N/A ) POLYPECTOMY  Patient Location: Endoscopy Unit  Anesthesia Type:General  Level of Consciousness: awake, alert , oriented and patient cooperative  Airway & Oxygen Therapy: Patient Spontanous Breathing  Post-op Assessment: Report given to RN, Post -op Vital signs reviewed and stable and Patient moving all extremities  Post vital signs: Reviewed and stable  Last Vitals:  Vitals Value Taken Time  BP    Temp    Pulse    Resp    SpO2      Last Pain:  Vitals:   09/22/20 1125  TempSrc:   PainSc: 0-No pain      Patients Stated Pain Goal: 6 (15/40/08 6761)  Complications: No complications documented.

## 2020-09-22 NOTE — Anesthesia Postprocedure Evaluation (Signed)
Anesthesia Post Note  Patient: JAIMIE REDDITT  Procedure(s) Performed: COLONOSCOPY WITH PROPOFOL (N/A ) POLYPECTOMY  Patient location during evaluation: Endoscopy Anesthesia Type: General Level of consciousness: awake and alert and oriented Pain management: pain level controlled Vital Signs Assessment: post-procedure vital signs reviewed and stable Respiratory status: spontaneous breathing and respiratory function stable Cardiovascular status: blood pressure returned to baseline and stable Postop Assessment: no apparent nausea or vomiting Anesthetic complications: no   No complications documented.   Last Vitals:  Vitals:   09/22/20 1049 09/22/20 1150  BP: 118/75 95/60  Pulse: 83 74  Resp: 20 18  Temp: 36.8 C 36.7 C  SpO2: 99% 98%    Last Pain:  Vitals:   09/22/20 1150  TempSrc: Oral  PainSc: 0-No pain                 Kaycee Haycraft C Konrad Hoak

## 2020-09-22 NOTE — Discharge Instructions (Addendum)
Colonoscopy Discharge Instructions  Read the instructions outlined below and refer to this sheet in the next few weeks. These discharge instructions provide you with general information on caring for yourself after you leave the hospital. Your doctor may also give you specific instructions. While your treatment has been planned according to the most current medical practices available, unavoidable complications occasionally occur.   ACTIVITY  You may resume your regular activity, but move at a slower pace for the next 24 hours.   Take frequent rest periods for the next 24 hours.   Walking will help get rid of the air and reduce the bloated feeling in your belly (abdomen).   No driving for 24 hours (because of the medicine (anesthesia) used during the test).    Do not sign any important legal documents or operate any machinery for 24 hours (because of the anesthesia used during the test).  NUTRITION  Drink plenty of fluids.   You may resume your normal diet as instructed by your doctor.   Begin with a light meal and progress to your normal diet. Heavy or fried foods are harder to digest and may make you feel sick to your stomach (nauseated).   Avoid alcoholic beverages for 24 hours or as instructed.  MEDICATIONS  You may resume your normal medications unless your doctor tells you otherwise.  WHAT YOU CAN EXPECT TODAY  Some feelings of bloating in the abdomen.   Passage of more gas than usual.   Spotting of blood in your stool or on the toilet paper.  IF YOU HAD POLYPS REMOVED DURING THE COLONOSCOPY:  No aspirin products for 7 days or as instructed.   No alcohol for 7 days or as instructed.   Eat a soft diet for the next 24 hours.  FINDING OUT THE RESULTS OF YOUR TEST Not all test results are available during your visit. If your test results are not back during the visit, make an appointment with your caregiver to find out the results. Do not assume everything is normal if  you have not heard from your caregiver or the medical facility. It is important for you to follow up on all of your test results.  SEEK IMMEDIATE MEDICAL ATTENTION IF:  You have more than a spotting of blood in your stool.   Your belly is swollen (abdominal distention).   You are nauseated or vomiting.   You have a temperature over 101.   You have abdominal pain or discomfort that is severe or gets worse throughout the day.   Your colonoscopy revealed 1 polyp(s) which I removed successfully. Await pathology results, my office will contact you. I recommend repeating colonoscopy in 7-10 years for surveillance purposes depending on pathology. Otherwise follow up with GI as needed.    I hope you have a great rest of your week!  Elon Alas. Abbey Chatters, D.O. Gastroenterology and Hepatology [facility]   Colon Polyps  Colon polyps are tissue growths inside the colon, which is part of the large intestine. They are one of the types of polyps that can grow in the body. A polyp may be a round bump or a mushroom-shaped growth. You could have one polyp or more than one. Most colon polyps are noncancerous (benign). However, some colon polyps can become cancerous over time. Finding and removing the polyps early can help prevent this. What are the causes? The exact cause of colon polyps is not known. What increases the risk? The following factors may make you  more likely to develop this condition:  Having a family history of colorectal cancer or colon polyps.  Being older than 65 years of age.  Being younger than 65 years of age and having a significant family history of colorectal cancer or colon polyps or a genetic condition that puts you at higher risk of getting colon polyps.  Having inflammatory bowel disease, such as ulcerative colitis or Crohn's disease.  Having certain conditions passed from parent to child (hereditary conditions), such as: ? Familial  adenomatous polyposis (FAP). ? Lynch syndrome. ? Turcot syndrome. ? Peutz-Jeghers syndrome. ? MUTYH-associated polyposis (MAP).  Being overweight.  Certain lifestyle factors. These include smoking cigarettes, drinking too much alcohol, not getting enough exercise, and eating a diet that is high in fat and red meat and low in fiber.  Having had childhood cancer that was treated with radiation of the abdomen. What are the signs or symptoms? Many times, there are no symptoms. If you have symptoms, they may include:  Blood coming from the rectum during a bowel movement.  Blood in the stool (feces). The blood may be bright red or very dark in color.  Pain in the abdomen.  A change in bowel habits, such as constipation or diarrhea. How is this diagnosed? This condition is diagnosed with a colonoscopy. This is a procedure in which a lighted, flexible scope is inserted into the opening between the buttocks (anus) and then passed into the colon to examine the area. Polyps are sometimes found when a colonoscopy is done as part of routine cancer screening tests. How is this treated? This condition is treated by removing any polyps that are found. Most polyps can be removed during a colonoscopy. Those polyps will then be tested for cancer. Additional treatment may be needed depending on the results of testing. Follow these instructions at home: Eating and drinking  Eat foods that are high in fiber, such as fruits, vegetables, and whole grains.  Eat foods that are high in calcium and vitamin D, such as milk, cheese, yogurt, eggs, liver, fish, and broccoli.  Limit foods that are high in fat, such as fried foods and desserts.  Limit the amount of red meat, precooked or cured meat, or other processed meat that you eat, such as hot dogs, sausages, bacon, or meat loaves.  Limit sugary drinks.   Lifestyle  Maintain a healthy weight, or lose weight if recommended by your health care  provider.  Exercise every day or as told by your health care provider.  Do not use any products that contain nicotine or tobacco, such as cigarettes, e-cigarettes, and chewing tobacco. If you need help quitting, ask your health care provider.  Do not drink alcohol if: ? Your health care provider tells you not to drink. ? You are pregnant, may be pregnant, or are planning to become pregnant.  If you drink alcohol: ? Limit how much you use to:  0-1 drink a day for women.  0-2 drinks a day for men. ? Know how much alcohol is in your drink. In the [place] one drink equals one 12 oz bottle of beer (355 mL), one 5 oz glass of wine (148 mL), or one 1 oz glass of hard liquor (44 mL). General instructions  Take over-the-counter and prescription medicines only as told by your health care provider.  Keep all follow-up visits. This is important. This includes having regularly scheduled colonoscopies. Talk to your health care provider about when you need a colonoscopy. Contact a health  care provider if:  You have new or worsening bleeding during a bowel movement.  You have new or increased blood in your stool.  You have a change in bowel habits.  You lose weight for no known reason. Summary  Colon polyps are tissue growths inside the colon, which is part of the large intestine. They are one type of polyp that can grow in the body.  Most colon polyps are noncancerous (benign), but some can become cancerous over time.  This condition is diagnosed with a colonoscopy.  This condition is treated by removing any polyps that are found. Most polyps can be removed during a colonoscopy. This information is not intended to replace advice given to you by your health care provider. Make sure you discuss any questions you have with your health care provider. Document Revised: 07/25/2019 Document Reviewed: 07/25/2019 Elsevier Patient Education  2021 [employer].

## 2020-09-22 NOTE — Anesthesia Preprocedure Evaluation (Addendum)
Anesthesia Evaluation  Patient identified by MRN, date of birth, ID band Patient awake    Reviewed: Allergy & Precautions, NPO status , Patient's Chart, lab work & pertinent test results  History of Anesthesia Complications Negative for: history of anesthetic complications  Airway Mallampati: II  TM Distance: >3 FB Neck ROM: Full    Dental  (+) Dental Advisory Given, Teeth Intact   Pulmonary pneumonia, resolved, former smoker,    Pulmonary exam normal breath sounds clear to auscultation       Cardiovascular Exercise Tolerance: Good + CAD  Normal cardiovascular exam Rhythm:Regular Rate:Normal     Neuro/Psych  Neuromuscular disease (RLS) negative psych ROS   GI/Hepatic (+)     substance abuse  marijuana use, Hepatitis -, C  Endo/Other  negative endocrine ROS  Renal/GU negative Renal ROS     Musculoskeletal negative musculoskeletal ROS (+)   Abdominal   Peds  Hematology negative hematology ROS (+)   Anesthesia Other Findings Meniere disease Prostate cancer   Reproductive/Obstetrics negative OB ROS                            Anesthesia Physical Anesthesia Plan  ASA: II  Anesthesia Plan: General   Post-op Pain Management:    Induction: Intravenous  PONV Risk Score and Plan: Propofol infusion  Airway Management Planned: Nasal Cannula and Natural Airway  Additional Equipment:   Intra-op Plan:   Post-operative Plan:   Informed Consent: I have reviewed the patients History and Physical, chart, labs and discussed the procedure including the risks, benefits and alternatives for the proposed anesthesia with the patient or authorized representative who has indicated his/her understanding and acceptance.     Dental advisory given  Plan Discussed with: CRNA and Surgeon  Anesthesia Plan Comments:         Anesthesia Quick Evaluation

## 2020-09-22 NOTE — Op Note (Signed)
[facility] Patient Name: Theodore Hart Procedure Date: 09/22/2020 11:20 AM MRN: [id number] Date of Birth: 06/12/1955 Attending MD: Elon Alas. Edgar Frisk CSN: [id number] Age: 65 Admit Type: Outpatient Procedure:                Colonoscopy Indications:              Screening for colorectal malignant neoplasm Providers:                Elon Alas. Abbey Chatters, DO, Gwynneth Albright RN, RN,                            Randa Spike, Technician Referring MD:              Medicines:                See the Anesthesia note for documentation of the                            administered medications Complications:            No immediate complications. Estimated Blood Loss:     Estimated blood loss was minimal. Procedure:                Pre-Anesthesia Assessment:                           - The anesthesia plan was to use monitored                            anesthesia care (MAC).                           After obtaining informed consent, the colonoscope                            was passed under direct vision. Throughout the                            procedure, the patient's blood pressure, pulse, and                            oxygen saturations were monitored continuously. The                            [id number]-[id number] ([id number]) scope was introduced through                            the anus and advanced to the the cecum, identified                            by appendiceal orifice and ileocecal valve. The                            colonoscopy was performed without difficulty. The                            patient tolerated the procedure  well. The quality                            of the bowel preparation was evaluated using the                            BBPS [facility] Bowel Preparation Scale) with scores                            of: Right Colon = 3, Transverse Colon = 3 and Left                            Colon = 3 (entire mucosa seen well with no residual                             staining, small fragments of stool or opaque                            liquid). The total BBPS score equals 9. Scope In: 11:27:30 AM Scope Out: 11:44:02 AM Scope Withdrawal Time: 0 hours 10 minutes 42 seconds  Total Procedure Duration: 0 hours 16 minutes 32 seconds  Findings:      The perianal and digital rectal examinations were normal.      Non-bleeding internal hemorrhoids were found during endoscopy.      A 2 mm polyp was found in the sigmoid colon. The polyp was sessile. The       polyp was removed with a cold biopsy forceps. Resection and retrieval       were complete.      The exam was otherwise without abnormality. Impression:               - Non-bleeding internal hemorrhoids.                           - One 2 mm polyp in the sigmoid colon, removed with                            a cold biopsy forceps. Resected and retrieved.                           - The examination was otherwise normal. Moderate Sedation:      Per Anesthesia Care Recommendation:           - Patient has a contact number available for                            emergencies. The signs and symptoms of potential                            delayed complications were discussed with the                            patient. Return to normal activities tomorrow.  Written discharge instructions were provided to the                            patient.                           - Resume previous diet.                           - Continue present medications.                           - Await pathology results.                           - Repeat colonoscopy in 7-10 years for surveillance.                           - Return to GI clinic PRN. Procedure Code(s):        --- Professional ---                           [phone], Colonoscopy, flexible; with biopsy, single                            or multiple Diagnosis Code(s):        --- Professional ---                           Z12.11, Encounter for  screening for malignant                            neoplasm of colon                           K63.5, Polyp of colon                           K64.8, Other hemorrhoids CPT copyright 2019 American Medical Association. All rights reserved. The codes documented in this report are preliminary and upon coder review may  be revised to meet current compliance requirements. Elon Alas. Abbey Chatters, DO [place] Abbey Chatters, DO 09/22/2020 11:49:27 AM This report has been signed electronically. Number of Addenda: 0

## 2020-09-23 LAB — SURGICAL PATHOLOGY

## 2020-09-30 ENCOUNTER — Encounter (HOSPITAL_COMMUNITY): Payer: Self-pay | Admitting: Internal Medicine

## 2020-10-08 DIAGNOSIS — Z6829 Body mass index (BMI) 29.0-29.9, adult: Secondary | ICD-10-CM | POA: Diagnosis not present

## 2020-10-08 DIAGNOSIS — Z0001 Encounter for general adult medical examination with abnormal findings: Secondary | ICD-10-CM | POA: Diagnosis not present

## 2020-10-08 DIAGNOSIS — Z1389 Encounter for screening for other disorder: Secondary | ICD-10-CM | POA: Diagnosis not present

## 2020-10-08 DIAGNOSIS — E7849 Other hyperlipidemia: Secondary | ICD-10-CM | POA: Diagnosis not present

## 2020-10-16 DIAGNOSIS — E663 Overweight: Secondary | ICD-10-CM | POA: Diagnosis not present

## 2020-10-16 DIAGNOSIS — Z6828 Body mass index (BMI) 28.0-28.9, adult: Secondary | ICD-10-CM | POA: Diagnosis not present

## 2020-10-16 DIAGNOSIS — I2510 Atherosclerotic heart disease of native coronary artery without angina pectoris: Secondary | ICD-10-CM | POA: Diagnosis not present

## 2020-11-16 DIAGNOSIS — E663 Overweight: Secondary | ICD-10-CM | POA: Diagnosis not present

## 2020-11-16 DIAGNOSIS — I2510 Atherosclerotic heart disease of native coronary artery without angina pectoris: Secondary | ICD-10-CM | POA: Diagnosis not present

## 2020-11-16 DIAGNOSIS — Z6828 Body mass index (BMI) 28.0-28.9, adult: Secondary | ICD-10-CM | POA: Diagnosis not present

## 2021-01-16 DIAGNOSIS — E782 Mixed hyperlipidemia: Secondary | ICD-10-CM | POA: Diagnosis not present

## 2021-01-16 DIAGNOSIS — I2510 Atherosclerotic heart disease of native coronary artery without angina pectoris: Secondary | ICD-10-CM | POA: Diagnosis not present

## 2021-03-19 DIAGNOSIS — Z6828 Body mass index (BMI) 28.0-28.9, adult: Secondary | ICD-10-CM | POA: Diagnosis not present

## 2021-03-19 DIAGNOSIS — E663 Overweight: Secondary | ICD-10-CM | POA: Diagnosis not present

## 2021-03-19 DIAGNOSIS — G4709 Other insomnia: Secondary | ICD-10-CM | POA: Diagnosis not present

## 2021-04-17 DIAGNOSIS — E782 Mixed hyperlipidemia: Secondary | ICD-10-CM | POA: Diagnosis not present

## 2021-04-17 DIAGNOSIS — I2510 Atherosclerotic heart disease of native coronary artery without angina pectoris: Secondary | ICD-10-CM | POA: Diagnosis not present

## 2021-07-17 DIAGNOSIS — I2510 Atherosclerotic heart disease of native coronary artery without angina pectoris: Secondary | ICD-10-CM | POA: Diagnosis not present

## 2021-07-17 DIAGNOSIS — E782 Mixed hyperlipidemia: Secondary | ICD-10-CM | POA: Diagnosis not present

## 2021-09-09 DIAGNOSIS — I2510 Atherosclerotic heart disease of native coronary artery without angina pectoris: Secondary | ICD-10-CM | POA: Diagnosis not present

## 2021-09-09 DIAGNOSIS — E782 Mixed hyperlipidemia: Secondary | ICD-10-CM | POA: Diagnosis not present

## 2021-09-09 DIAGNOSIS — M109 Gout, unspecified: Secondary | ICD-10-CM | POA: Diagnosis not present

## 2021-09-09 DIAGNOSIS — Z23 Encounter for immunization: Secondary | ICD-10-CM | POA: Diagnosis not present

## 2021-09-09 DIAGNOSIS — B1920 Unspecified viral hepatitis C without hepatic coma: Secondary | ICD-10-CM | POA: Diagnosis not present

## 2021-09-09 DIAGNOSIS — Z1331 Encounter for screening for depression: Secondary | ICD-10-CM | POA: Diagnosis not present

## 2021-09-09 DIAGNOSIS — E7849 Other hyperlipidemia: Secondary | ICD-10-CM | POA: Diagnosis not present

## 2021-09-09 DIAGNOSIS — Z6829 Body mass index (BMI) 29.0-29.9, adult: Secondary | ICD-10-CM | POA: Diagnosis not present

## 2021-09-09 DIAGNOSIS — Z125 Encounter for screening for malignant neoplasm of prostate: Secondary | ICD-10-CM | POA: Diagnosis not present

## 2021-09-09 DIAGNOSIS — H8103 Meniere's disease, bilateral: Secondary | ICD-10-CM | POA: Diagnosis not present

## 2021-09-09 DIAGNOSIS — Z8546 Personal history of malignant neoplasm of prostate: Secondary | ICD-10-CM | POA: Diagnosis not present

## 2021-09-09 DIAGNOSIS — Z0001 Encounter for general adult medical examination with abnormal findings: Secondary | ICD-10-CM | POA: Diagnosis not present

## 2021-12-16 DIAGNOSIS — B354 Tinea corporis: Secondary | ICD-10-CM | POA: Diagnosis not present

## 2021-12-16 DIAGNOSIS — E663 Overweight: Secondary | ICD-10-CM | POA: Diagnosis not present

## 2021-12-16 DIAGNOSIS — Z6827 Body mass index (BMI) 27.0-27.9, adult: Secondary | ICD-10-CM | POA: Diagnosis not present

## 2021-12-16 DIAGNOSIS — F419 Anxiety disorder, unspecified: Secondary | ICD-10-CM | POA: Diagnosis not present

## 2022-05-18 DIAGNOSIS — Z0001 Encounter for general adult medical examination with abnormal findings: Secondary | ICD-10-CM | POA: Diagnosis not present

## 2022-05-18 DIAGNOSIS — E7849 Other hyperlipidemia: Secondary | ICD-10-CM | POA: Diagnosis not present

## 2022-05-18 DIAGNOSIS — E782 Mixed hyperlipidemia: Secondary | ICD-10-CM | POA: Diagnosis not present

## 2022-05-18 DIAGNOSIS — B1920 Unspecified viral hepatitis C without hepatic coma: Secondary | ICD-10-CM | POA: Diagnosis not present

## 2022-05-18 DIAGNOSIS — E663 Overweight: Secondary | ICD-10-CM | POA: Diagnosis not present

## 2022-05-18 DIAGNOSIS — Z8546 Personal history of malignant neoplasm of prostate: Secondary | ICD-10-CM | POA: Diagnosis not present

## 2022-05-18 DIAGNOSIS — H8103 Meniere's disease, bilateral: Secondary | ICD-10-CM | POA: Diagnosis not present

## 2022-05-18 DIAGNOSIS — M109 Gout, unspecified: Secondary | ICD-10-CM | POA: Diagnosis not present

## 2022-05-18 DIAGNOSIS — Z6829 Body mass index (BMI) 29.0-29.9, adult: Secondary | ICD-10-CM | POA: Diagnosis not present

## 2022-05-18 DIAGNOSIS — G4709 Other insomnia: Secondary | ICD-10-CM | POA: Diagnosis not present

## 2022-05-18 DIAGNOSIS — F419 Anxiety disorder, unspecified: Secondary | ICD-10-CM | POA: Diagnosis not present

## 2022-05-18 DIAGNOSIS — Z1331 Encounter for screening for depression: Secondary | ICD-10-CM | POA: Diagnosis not present

## 2022-05-18 DIAGNOSIS — I2510 Atherosclerotic heart disease of native coronary artery without angina pectoris: Secondary | ICD-10-CM | POA: Diagnosis not present

## 2022-07-29 DIAGNOSIS — Z139 Encounter for screening, unspecified: Secondary | ICD-10-CM | POA: Diagnosis not present

## 2022-07-29 DIAGNOSIS — E669 Obesity, unspecified: Secondary | ICD-10-CM | POA: Diagnosis not present

## 2022-07-29 DIAGNOSIS — R0602 Shortness of breath: Secondary | ICD-10-CM | POA: Diagnosis not present

## 2022-07-29 DIAGNOSIS — I4711 Inappropriate sinus tachycardia, so stated: Secondary | ICD-10-CM | POA: Diagnosis not present

## 2022-07-29 DIAGNOSIS — R000 Tachycardia, unspecified: Secondary | ICD-10-CM | POA: Diagnosis not present

## 2022-07-29 DIAGNOSIS — Z6830 Body mass index (BMI) 30.0-30.9, adult: Secondary | ICD-10-CM | POA: Diagnosis not present

## 2022-07-29 DIAGNOSIS — R079 Chest pain, unspecified: Secondary | ICD-10-CM | POA: Diagnosis not present

## 2022-08-16 DIAGNOSIS — I479 Paroxysmal tachycardia, unspecified: Secondary | ICD-10-CM | POA: Diagnosis not present

## 2022-09-02 DIAGNOSIS — Z1283 Encounter for screening for malignant neoplasm of skin: Secondary | ICD-10-CM | POA: Diagnosis not present

## 2022-09-02 DIAGNOSIS — D2271 Melanocytic nevi of right lower limb, including hip: Secondary | ICD-10-CM | POA: Diagnosis not present

## 2022-09-02 DIAGNOSIS — L308 Other specified dermatitis: Secondary | ICD-10-CM | POA: Diagnosis not present

## 2022-09-02 DIAGNOSIS — L570 Actinic keratosis: Secondary | ICD-10-CM | POA: Diagnosis not present

## 2022-09-02 DIAGNOSIS — D225 Melanocytic nevi of trunk: Secondary | ICD-10-CM | POA: Diagnosis not present

## 2022-09-02 DIAGNOSIS — X32XXXA Exposure to sunlight, initial encounter: Secondary | ICD-10-CM | POA: Diagnosis not present

## 2022-09-02 DIAGNOSIS — D485 Neoplasm of uncertain behavior of skin: Secondary | ICD-10-CM | POA: Diagnosis not present

## 2022-11-15 DIAGNOSIS — E6609 Other obesity due to excess calories: Secondary | ICD-10-CM | POA: Diagnosis not present

## 2022-11-15 DIAGNOSIS — Z6829 Body mass index (BMI) 29.0-29.9, adult: Secondary | ICD-10-CM | POA: Diagnosis not present

## 2022-11-15 DIAGNOSIS — F419 Anxiety disorder, unspecified: Secondary | ICD-10-CM | POA: Diagnosis not present

## 2023-05-24 DIAGNOSIS — G4709 Other insomnia: Secondary | ICD-10-CM | POA: Diagnosis not present

## 2023-05-24 DIAGNOSIS — E6609 Other obesity due to excess calories: Secondary | ICD-10-CM | POA: Diagnosis not present

## 2023-05-24 DIAGNOSIS — Z0001 Encounter for general adult medical examination with abnormal findings: Secondary | ICD-10-CM | POA: Diagnosis not present

## 2023-05-24 DIAGNOSIS — I2510 Atherosclerotic heart disease of native coronary artery without angina pectoris: Secondary | ICD-10-CM | POA: Diagnosis not present

## 2023-05-24 DIAGNOSIS — Z6828 Body mass index (BMI) 28.0-28.9, adult: Secondary | ICD-10-CM | POA: Diagnosis not present

## 2023-05-24 DIAGNOSIS — E782 Mixed hyperlipidemia: Secondary | ICD-10-CM | POA: Diagnosis not present

## 2023-05-24 DIAGNOSIS — Z1331 Encounter for screening for depression: Secondary | ICD-10-CM | POA: Diagnosis not present

## 2023-05-24 DIAGNOSIS — Z8546 Personal history of malignant neoplasm of prostate: Secondary | ICD-10-CM | POA: Diagnosis not present

## 2023-05-24 DIAGNOSIS — F419 Anxiety disorder, unspecified: Secondary | ICD-10-CM | POA: Diagnosis not present
